# Patient Record
Sex: Male | Born: 1955
Health system: Southern US, Community
[De-identification: ages and names within clinical notes are randomized; demographics above are authoritative.]

## PROBLEM LIST (undated history)

## (undated) DIAGNOSIS — S82899A Other fracture of unspecified lower leg, initial encounter for closed fracture: Secondary | ICD-10-CM

## (undated) DIAGNOSIS — E785 Hyperlipidemia, unspecified: Secondary | ICD-10-CM

## (undated) DIAGNOSIS — I10 Essential (primary) hypertension: Secondary | ICD-10-CM

---

## 2012-01-25 ENCOUNTER — Emergency Department (HOSPITAL_COMMUNITY)
Admission: EM | Admit: 2012-01-25 | Discharge: 2012-01-25 | Disposition: A | Discharge status: Home Health | Payer: BC Managed Care – PPO | Attending: Emergency Medicine | Admitting: Emergency Medicine

## 2012-01-25 ENCOUNTER — Emergency Department (HOSPITAL_COMMUNITY): Payer: BC Managed Care – PPO

## 2012-01-25 ENCOUNTER — Encounter (HOSPITAL_COMMUNITY): Payer: Self-pay | Admitting: Emergency Medicine

## 2012-01-25 DIAGNOSIS — S9000XA Contusion of unspecified ankle, initial encounter: Secondary | ICD-10-CM | POA: Insufficient documentation

## 2012-01-25 DIAGNOSIS — S8990XA Unspecified injury of unspecified lower leg, initial encounter: Secondary | ICD-10-CM | POA: Insufficient documentation

## 2012-01-25 DIAGNOSIS — M79609 Pain in unspecified limb: Secondary | ICD-10-CM | POA: Insufficient documentation

## 2012-01-25 DIAGNOSIS — M25579 Pain in unspecified ankle and joints of unspecified foot: Secondary | ICD-10-CM | POA: Insufficient documentation

## 2012-01-25 DIAGNOSIS — X500XXA Overexertion from strenuous movement or load, initial encounter: Secondary | ICD-10-CM | POA: Insufficient documentation

## 2012-01-25 DIAGNOSIS — M25473 Effusion, unspecified ankle: Secondary | ICD-10-CM | POA: Insufficient documentation

## 2012-01-25 DIAGNOSIS — S82402A Unspecified fracture of shaft of left fibula, initial encounter for closed fracture: Secondary | ICD-10-CM

## 2012-01-25 DIAGNOSIS — S82843A Displaced bimalleolar fracture of unspecified lower leg, initial encounter for closed fracture: Secondary | ICD-10-CM | POA: Insufficient documentation

## 2012-01-25 DIAGNOSIS — S82899A Other fracture of unspecified lower leg, initial encounter for closed fracture: Secondary | ICD-10-CM

## 2012-01-25 DIAGNOSIS — M25476 Effusion, unspecified foot: Secondary | ICD-10-CM | POA: Insufficient documentation

## 2012-01-25 HISTORY — DX: Hyperlipidemia, unspecified: E78.5

## 2012-01-25 MED ORDER — FENTANYL CITRATE 0.05 MG/ML IJ SOLN
50.0000 ug | Freq: Once | INTRAMUSCULAR | Status: AC
  Administered 2012-01-25: 50 ug via INTRAVENOUS
  Filled 2012-01-25: qty 2

## 2012-01-25 MED ORDER — PROPOFOL 10 MG/ML IV BOLUS
INTRAVENOUS | Status: DC | PRN
  Administered 2012-01-25 (×2): 40 mg via INTRAVENOUS

## 2012-01-25 MED ORDER — HYDROMORPHONE HCL PF 1 MG/ML IJ SOLN
1.0000 mg | Freq: Once | INTRAMUSCULAR | Status: AC
  Administered 2012-01-25: 1 mg via INTRAVENOUS
  Filled 2012-01-25: qty 1

## 2012-01-25 MED ORDER — ONDANSETRON HCL 4 MG/2ML IJ SOLN
4.0000 mg | Freq: Once | INTRAMUSCULAR | Status: AC
  Administered 2012-01-25: 4 mg via INTRAVENOUS
  Filled 2012-01-25: qty 2

## 2012-01-25 MED ORDER — PROPOFOL 10 MG/ML IV BOLUS
200.0000 mg | Freq: Once | INTRAVENOUS | Status: AC
  Administered 2012-01-25: 80 mg via INTRAVENOUS

## 2012-01-25 MED ORDER — OXYCODONE-ACETAMINOPHEN 5-325 MG PO TABS: 1.0000 | ORAL_TABLET | Freq: Four times a day (QID) | ORAL | Status: DC | PRN

## 2012-01-25 MED ORDER — PROPOFOL 10 MG/ML IV EMUL
INTRAVENOUS | Status: AC
  Filled 2012-01-25: qty 20

## 2012-01-25 NOTE — ED Notes (Addendum)
Pt brought in via EMS from home. Pt states he was working on [employer] and tangled foot in wire fence and fell. Left ankle swollen with obvious deformity. EMS placed rigid foam splint to ankle with pulses present pre and post splint. Pt given Morphine 10mg  IV by EMS.

## 2012-01-25 NOTE — ED Notes (Signed)
Consent signed by patient for reduction of left ankle, patient with verbalized understanding of procedure with no questions.

## 2012-01-25 NOTE — ED Notes (Signed)
Left ankle reduced by [place], spint applied per ortho tech.

## 2012-01-25 NOTE — ED Notes (Addendum)
Patient has returned to preprocedure neuro status.

## 2012-01-25 NOTE — Progress Notes (Signed)
Orthopedic Tech Progress Note Patient Details:  Darren Russo 1956/03/28 [id number]  Other Ortho Devices Type of Ortho Device: Crutches Ortho Device Location: (L) LE Ortho Device Interventions: Ordered  Type of Splint: Short Leg;Stirrup Splint Location: (L) LE Splint Interventions: Application    [name] 01/25/2012, 8:51 PM

## 2012-01-25 NOTE — ED Provider Notes (Signed)
Medical screening examination/treatment/procedure(s) were conducted as a shared visit with non-physician practitioner(s) and myself.  I personally evaluated the patient during the encounter.  Closed fracture dislocation left ankle. Reduced in the ER under conscious sedation. He'll followup with orthopedic surgery  Procedural sedation Performed by: Billee Cashing. Consent: Verbal consent obtained. Risks and benefits: risks, benefits and alternatives were discussed Required items: required blood products, implants, devices, and special equipment available Patient identity confirmed: arm band and provided demographic data Time out: Immediately prior to procedure a "time out" was called to verify the correct patient, procedure, equipment, support staff and site/side marked as required.  Sedation type: moderate (conscious) sedation NPO time confirmed and considedered  Sedatives: PROPOFOL  Physician Time at Bedside: 10  Vitals: Vital signs were monitored during sedation. Cardiac Monitor, pulse oximeter Patient tolerance: Patient tolerated the procedure well with no immediate complications. Comments: Pt with uneventful recovered. Returned to pre-procedural sedation baseline    Juliet Rude. Rubin Payor, MD 01/25/12 [phone]

## 2012-01-25 NOTE — ED Notes (Signed)
Patient prepared for conscious sedation, patient on cardiac monitor, oxygen at 2 lpm via [place], airway cart at bedside.

## 2012-01-25 NOTE — Discharge Instructions (Signed)
Ankle Fracture A fracture is a break in the bone. A cast or splint is used to protect and keep your injured bone from moving.  HOME CARE INSTRUCTIONS   Use your crutches as directed.   To lessen the swelling, keep the injured leg elevated while sitting or lying down.   Apply ice to the injury for 15 to 20 minutes, 3 to 4 times per day while awake for 2 days. Put the ice in a plastic bag and place a thin towel between the bag of ice and your cast.   If you have a plaster or fiberglass cast:   Do not try to scratch the skin under the cast using sharp or pointed objects.   Check the skin around the cast every day. You may put lotion on any red or sore areas.   Keep your cast dry and clean.   If you have a plaster splint:   Wear the splint as directed.   You may loosen the elastic around the splint if your toes become numb, tingle, or turn cold or blue.   Do not put pressure on any part of your cast or splint; it may break. Rest your cast only on a pillow the first 24 hours until it is fully hardened.   Your cast or splint can be protected during bathing with a plastic bag. Do not lower the cast or splint into water.   Take medications as directed by your caregiver. Only take over-the-counter or prescription medicines for pain, discomfort, or fever as directed by your caregiver.   Do not drive a vehicle until your caregiver specifically tells you it is safe to do so.   If your caregiver has given you a follow-up appointment, it is very important to keep that appointment. Not keeping the appointment could result in a chronic or permanent injury, pain, and disability. If there is any problem keeping the appointment, you must call back to this facility for assistance.  SEEK IMMEDIATE MEDICAL CARE IF:   Your cast gets damaged or breaks.   You have continued severe pain or more swelling than you did before the cast was put on.   Your skin or toenails below the injury turn blue or gray,  or feel cold or numb.   There is a bad smell or new stains and/or purulent (pus like) drainage coming from under the cast.  If you do not have a window in your cast for observing the wound, a discharge or minor bleeding may show up as a stain on the outside of your cast. Report these findings to your caregiver. MAKE SURE YOU:   Understand these instructions.   Will watch your condition.   Will get help right away if you are not doing well or get worse.  Document Released: 10/12/2000 Document Revised: 10/04/2011 Document Reviewed: 05/18/2008 [facility] Patient Information 2012 [place], [place].  Fibular Fracture, Ankle, Adult, Treated with or without Immobilization You have a fracture (break) of your fibula. This is the bone in your lower leg located on the outside of the leg. These fractures are easily diagnosed with x-rays. TREATMENT  You have a simple fracture of the part of the fibula which is located between the knee and ankle. This bone usually will heal without problems and can often be treated without casting or splinting. This means the fracture will heal well during normal use and daily activities without being held in place. Sometimes a cast or splint is placed on these fractures if  it is needed for comfort or if the bones are badly out of place. HOME CARE INSTRUCTIONS   Apply ice to the injury for 15 to 20 minutes, 3 to 4 times per day while awake, for 2 days. Put the ice in a plastic bag and place a thin towel between the bag of ice and your leg. This helps keep swelling down.   Use crutches as directed. Resume walking without crutches as directed by your caregiver or when comfortable doing so.   Only take over-the-counter or prescription medicines for pain, discomfort, or fever as directed by your caregiver.   Keep appointments for follow up x-rays if these are required.   If you have a removable splint or boot, do not remove the boot unless directed by your caregiver.    Warning: Do not drive a car or operate a motor vehicle until your caregiver specifically tells you it is safe to do so.  SEEK MEDICAL CARE IF:   You have continued severe pain or more swelling   The medications do not control the pain.   Your skin or nails below the injury turn blue or grey or feel cold or numb.   You develop severe pain in the leg or foot.  MAKE SURE YOU:   Understand these instructions.   Will watch your condition.   Will get help right away if you are not doing well or get worse.  Document Released: 10/15/2005 Document Revised: 10/04/2011 Document Reviewed: 05/21/2008 [facility] Patient Information 2012 [place], [place].

## 2012-01-25 NOTE — ED Provider Notes (Signed)
History     CSN: [id number]  Arrival date & time 01/25/12  1191   First MD Initiated Contact with Patient 01/25/12 1732      Chief Complaint  Patient presents with  . Ankle Pain    (Consider location/radiation/quality/duration/timing/severity/associated sxs/prior treatment) HPI  56 y.o M presents c/o L ankle injury.  Was working on [employer] when L foot got tangled in wire fence and caused it to twist.  He realized that he may have broken it, and were able to lower self to ground.  Denies hitting head or LOC however notice sig pain to L ankle. Denies any other injury.  Pain is sharp, throbbing, persistence, but denies numbness.  Last meal was a sneaker bar 2 hrs ago.  Was given morphine 10mg  en route via EMS.  Sts pain is a 5/10 at this time.  Denies any allergies.    History reviewed. No pertinent past medical history.  History reviewed. No pertinent past surgical history.  History reviewed. No pertinent family history.  History  Substance Use Topics  . Smoking status: Not on file  . Smokeless tobacco: Not on file  . Alcohol Use: Not on file      Review of Systems  All other systems reviewed and are negative.    Allergies  Review of patient's allergies indicates not on file.  Home Medications  No current outpatient prescriptions on file.  Ht 6' (1.829 m)  Wt 184 lb (83.462 kg)  BMI 24.95 kg/m2  Physical Exam  Nursing note and vitals reviewed. Constitutional: He appears well-developed and well-nourished. No distress.  HENT:  Head: Atraumatic.  Eyes: Conjunctivae are normal.  Neck: Neck supple.  Cardiovascular: Normal rate and regular rhythm.   Pulmonary/Chest: Effort normal. No respiratory distress.  Abdominal: Soft.  Musculoskeletal: He exhibits tenderness. He exhibits no edema.       Right hip: Normal.       Right knee: Normal.       Left knee: Normal.       Right ankle: He exhibits normal range of motion, no swelling, no ecchymosis, no deformity,  no laceration and normal pulse. No head of 5th metatarsal tenderness found.       Left ankle: He exhibits decreased range of motion, swelling, ecchymosis and deformity. He exhibits no laceration and normal pulse. tenderness. Lateral malleolus, medial malleolus, posterior TFL and proximal fibula tenderness found. No head of 5th metatarsal tenderness found.       Left lower leg: He exhibits no swelling, no edema and no deformity.       L ankle: obvious deformity at ankle laterally with skin tinting medially but no open fracture.  Tenderness throughout.  Palpable pedal pulse with brisk cap refills.  L foot with no deformity or point tenderness.  Normal L ankle.      ED Course  Reduction of fracture of L ankle Date/Time: 01/25/2012 8:13 PM Performed by: Fayrene Helper Authorized by: Fayrene Helper Consent: Verbal consent obtained. Written consent obtained. Risks and benefits: risks, benefits and alternatives were discussed Consent given by: patient Patient understanding: patient states understanding of the procedure being performed Patient consent: the patient's understanding of the procedure matches consent given Procedure consent: procedure consent matches procedure scheduled Relevant documents: relevant documents present and verified Test results: test results available and properly labeled Site marked: the operative site was marked Imaging studies: imaging studies available Required items: required blood products, implants, devices, and special equipment available Patient identity confirmed: arm band and  verbally with patient Time out: Immediately prior to procedure a "time out" was called to verify the correct patient, procedure, equipment, support staff and site/side marked as required. Local anesthesia used: no Patient sedated: yes Sedatives: propofol Analgesia: hydromorphone Sedation start date/time: 01/25/2012 8:00 PM Sedation end date/time: 01/25/2012 8:14 PM Vitals: Vital signs were  monitored during sedation. Patient tolerance: Patient tolerated the procedure well with no immediate complications.   (including critical care time)  Labs Reviewed - No data to display No results found.   No diagnosis found.  No results found for this or any previous visit. Dg Tibia/fibula Left  01/25/2012  *RADIOLOGY REPORT*  Clinical Data: Postreduction.  Left lower leg pain, injury.  LEFT TIBIA AND FIBULA - 2 VIEW  Comparison: Left ankle series 01/25/2012  Findings: Interval reduction of the left ankle fracture dislocation.  Mild continued displacement of the medial malleolar fracture and slight displacement of the posterior malleolar fracture.  Ankle mortise alignment much improved.  There is an oblique fracture through the proximal and midportions of the left fibular shaft.  Approximate 7 mm of anterior displacement of the distal fragment relative to the proximal fragment.  IMPRESSION: Postreduction of the left ankle as above.  Oblique mildly displaced proximal and mid shaft fibular fracture.  Original Report Authenticated By: Cyndie Chime, M.D.   Dg Ankle Complete Left  01/25/2012  *RADIOLOGY REPORT*  Clinical Data: Postreduction.  LEFT ANKLE COMPLETE - 3+ VIEW  Comparison: 01/25/2012  Findings: Interval reduction of the left ankle fracture dislocation.  Slight displacement at the medial and posterior malleolar fractures.  Ankle mortise in near anatomic alignment.  IMPRESSION: Interval reduction as above.  Original Report Authenticated By: Cyndie Chime, M.D.   Dg Ankle Complete Left  01/25/2012  *RADIOLOGY REPORT*  Clinical Data: Trauma.  Left ankle pain.  LEFT ANKLE COMPLETE - 3+ VIEW  Comparison: None.  Findings: Medial and posterior malleolar fracture present with posterior dislocation of the talus relative to the tibial plafond. The lateral malleolus grossly appears intact although there could be avulsion fragments from the lateral malleolus associated with ankle dislocation.   Subtalar joints appear intact.  Talonavicular joint and calcaneal cuboid joint appear intact.  Obvious soft tissue deformity is present.  There is no gas in the soft tissues to suggest open fracture.  IMPRESSION: Medial and posterior malleolar ankle fracture with tibiotalar dislocation.  Original Report Authenticated By: Andreas Newport, M.D.    No results found for this or any previous visit. Dg Tibia/fibula Left  01/25/2012  *RADIOLOGY REPORT*  Clinical Data: Postreduction.  Left lower leg pain, injury.  LEFT TIBIA AND FIBULA - 2 VIEW  Comparison: Left ankle series 01/25/2012  Findings: Interval reduction of the left ankle fracture dislocation.  Mild continued displacement of the medial malleolar fracture and slight displacement of the posterior malleolar fracture.  Ankle mortise alignment much improved.  There is an oblique fracture through the proximal and midportions of the left fibular shaft.  Approximate 7 mm of anterior displacement of the distal fragment relative to the proximal fragment.  IMPRESSION: Postreduction of the left ankle as above.  Oblique mildly displaced proximal and mid shaft fibular fracture.  Original Report Authenticated By: Cyndie Chime, M.D.   Dg Ankle Complete Left  01/25/2012  *RADIOLOGY REPORT*  Clinical Data: Postreduction.  LEFT ANKLE COMPLETE - 3+ VIEW  Comparison: 01/25/2012  Findings: Interval reduction of the left ankle fracture dislocation.  Slight displacement at the medial and posterior malleolar fractures.  Ankle  mortise in near anatomic alignment.  IMPRESSION: Interval reduction as above.  Original Report Authenticated By: Cyndie Chime, M.D.   Dg Ankle Complete Left  01/25/2012  *RADIOLOGY REPORT*  Clinical Data: Trauma.  Left ankle pain.  LEFT ANKLE COMPLETE - 3+ VIEW  Comparison: None.  Findings: Medial and posterior malleolar fracture present with posterior dislocation of the talus relative to the tibial plafond. The lateral malleolus grossly appears intact  although there could be avulsion fragments from the lateral malleolus associated with ankle dislocation.  Subtalar joints appear intact.  Talonavicular joint and calcaneal cuboid joint appear intact.  Obvious soft tissue deformity is present.  There is no gas in the soft tissues to suggest open fracture.  IMPRESSION: Medial and posterior malleolar ankle fracture with tibiotalar dislocation.  Original Report Authenticated By: Andreas Newport, M.D.       MDM  Obvious fracture dislocation on initial exam.  Will obtain L ankle xray for further evaluation.  Fentanyl IV given for pain.  No other injury.  Pedal pulse palpable, sensation intact.  My attending also presence and have evaluate pt.    8:15 PM Under my attending's supervision, L ankle were reduced by me.  Ortho tech apply splinting immediately afterward.  Pt tolerates procedure well. WIll perform post-op xray of L ankle.      9:17 PM Postreduction film shows normal alignment has been established. X-ray of tib-fib shows evidence of Mas[place]uve fracture.  With consult with Dr. Lequita Halt.  He is aware.  Long leg splinting applied, with crutches. Patient able to tolerate by mouth pain medication.  Fayrene Helper, PA-C 01/25/12 2145

## 2012-01-29 ENCOUNTER — Encounter (HOSPITAL_COMMUNITY): Payer: Self-pay | Admitting: *Deleted

## 2012-01-29 NOTE — H&P (Signed)
  Arna Medici DOB: 1956-01-15  Chief Complaint: left ankle pain  History of Present Illness The patient is a 56 year old male who is scheduled for an open reduction internal fixation of the left ankle medial malleolus by Dr. Darrelyn Hillock at [facility] on Wednesday January 30, 2012. The patient reports left ankle symptoms including: pain and swelling which began 4 day(s) ago following a specific injury in which he hopped a fence and his pants got caught causing him to land badly on his left foot. He was taken to [facility] where xrays were taken. The injury occurred due to a fall while the patient was at home. The patient does not report any radiation of symptoms. He reports that the ankle was dislocated and the ankle was reduced once at the ED.Marland Kitchen He reports that since then he has been nonweightbearing and using crutches.    Allergies No Known Drug Allergies.    Past Medical History Hypercholesteremia   Family History Cancer. mother Heart Disease. father Rheumatoid Arthritis. grandmother mothers side   Social History Alcohol use. current drinker; drinks beer; only occasionally per week Children. 1 Current work status. working full time Exercise. Exercises rarely; does other Living situation. live with spouse Marital status. married Number of flights of stairs before winded. greater than 5 Tobacco / smoke exposure. no Tobacco use. never smoker   Medication History Percocet (5-325MG  Tablet, Oral 4-6 hours, prn pain) Active. Lipitor (40MG  Tablet, Oral daily) Active.   Past Surgical History No pertinent past surgical history   Review of Systems General:Not Present- Chills, Fever, Night Sweats, Appetite Loss, Fatigue, Feeling sick, Weight Gain and Weight Loss. Skin:Not Present- Itching, Rash, Skin Color Changes, Ulcer, Psoriasis and Change in Hair or Nails. HEENT:Not Present- Sensitivity to light, Hearing problems, Nose Bleed and Ringing in the  Ears. Neck:Not Present- Swollen Glands and Neck Mass. Respiratory:Not Present- Snoring, Chronic Cough, Bloody sputum and Dyspnea. Cardiovascular:Not Present- Shortness of Breath, Chest Pain, Swelling of Extremities, Leg Cramps and Palpitations. Gastrointestinal:Not Present- Bloody Stool, Heartburn, Abdominal Pain, Vomiting, Nausea and Incontinence of Stool. Male Genitourinary:Not Present- Blood in Urine, Frequency, Incontinence and Nocturia. Musculoskeletal:Present- Joint Swelling and Joint Pain. Not Present- Muscle Weakness, Muscle Pain, Joint Stiffness and Back Pain. Neurological:Not Present- Tingling, Numbness, Burning, Tremor, Headaches and Dizziness. Psychiatric:Not Present- Anxiety, Depression and Memory Loss. Endocrine:Not Present- Cold Intolerance, Heat Intolerance, Excessive hunger and Excessive Thirst. Hematology:Not Present- Abnormal Bleeding, Anemia, Blood Clots and Easy Bruising.   Vitals Weight: 185 lb Height: 72 in Body Surface Area: 2.06 m Body Mass Index: 25.09 kg/m Pain Level: 1/10 Pulse: 90 (Regular) BP: 155/95 (Sitting, Left Arm, Standard)    Physical Exam Alert and oriented. No acute distress. Left ankle severe soft tissue swelling and brusing over the medial and lateral malleolus. Dorsalis pedis pulse 2+. Sensation intact. Plantar flexion and dorsiflexion intact but with pain. Calves soft and nontender. Left knee normal painless ROM. Heart soundes normal. Regular rate and rhythm. No murmurs. Lungs clear to auscultation. Abdomen soft and nontender. Oral cavity unremarkable.  Radiographs X-rays were taken in the ED at [facility] on friday 01/25/12 the day of the injury. Medial and posterior malleolar fracture present with posterior dislocation the was reduced in the ED. Also present is an oblique fracture of the left proximal to mid fibula with anterior displacement.   Assessment & Plan Medial malleolar fracture (824.0) ORIF medial  malleolus left ankle   Dimitri Ped, PA-C

## 2012-01-29 NOTE — Discharge Instructions (Signed)
Darren Russo  01/29/2012   Your procedure is scheduled on:  01/30/12  Report to SHORT STAY DEPT  at 9:00 AM.  Call this number if you have problems the morning of surgery: [phone]   Remember:   Do not eat food or drink liquids AFTER MIDNIGHT  May have clear liquids UNTIL 6 HOURS BEFORE SURGERY  Clear liquids include soda, tea, black coffee, apple or grape juice, broth.  Take these medicines the morning of surgery with A SIP OF WATER: Lipitor / percocet   Do not wear jewelry, make-up or nail polish.  Do not wear lotions, powders, or perfumes.   Do not shave legs or underarms 48 hrs. before surgery (men may shave face)  Do not bring valuables to the hospital.  Contacts, dentures or bridgework may not be worn into surgery.  Leave suitcase in the car. After surgery it may be brought to your room.  For patients admitted to the hospital, checkout time is 11:00 AM the day of discharge.   Patients discharged the day of surgery will not be allowed to drive home.  Name and phone number of your driver: wife  Special Instructions:   Please read over the following fact sheets that you were given: MRSA  Information               SHOWER WITH BETASEPT THE NIGHT BEFORE SURGERY AND THE MORNING OF SURGERY

## 2012-01-30 ENCOUNTER — Inpatient Hospital Stay (HOSPITAL_COMMUNITY)
Admission: RE | Admit: 2012-01-30 | Discharge: 2012-02-01 | DRG: 219 | Disposition: A | Discharge status: Home / Self Care | Payer: BC Managed Care – PPO | Source: Ambulatory Visit | Attending: Orthopedic Surgery | Admitting: Orthopedic Surgery

## 2012-01-30 ENCOUNTER — Encounter (HOSPITAL_COMMUNITY)
Admission: RE | Disposition: A | Discharge status: Home / Self Care | Payer: Self-pay | Source: Ambulatory Visit | Attending: Orthopedic Surgery

## 2012-01-30 ENCOUNTER — Ambulatory Visit (HOSPITAL_COMMUNITY): Payer: BC Managed Care – PPO

## 2012-01-30 ENCOUNTER — Encounter (HOSPITAL_COMMUNITY): Payer: Self-pay | Admitting: *Deleted

## 2012-01-30 ENCOUNTER — Encounter (HOSPITAL_COMMUNITY): Payer: Self-pay | Admitting: Anesthesiology

## 2012-01-30 ENCOUNTER — Ambulatory Visit (HOSPITAL_COMMUNITY): Payer: BC Managed Care – PPO | Admitting: Anesthesiology

## 2012-01-30 DIAGNOSIS — Y92009 Unspecified place in unspecified non-institutional (private) residence as the place of occurrence of the external cause: Secondary | ICD-10-CM

## 2012-01-30 DIAGNOSIS — S82843A Displaced bimalleolar fracture of unspecified lower leg, initial encounter for closed fracture: Principal | ICD-10-CM | POA: Diagnosis present

## 2012-01-30 DIAGNOSIS — W1789XA Other fall from one level to another, initial encounter: Secondary | ICD-10-CM | POA: Diagnosis present

## 2012-01-30 DIAGNOSIS — E785 Hyperlipidemia, unspecified: Secondary | ICD-10-CM | POA: Diagnosis present

## 2012-01-30 HISTORY — PX: ORIF ANKLE FRACTURE: SHX5408

## 2012-01-30 HISTORY — DX: Other fracture of unspecified lower leg, initial encounter for closed fracture: S82.899A

## 2012-01-30 LAB — URINALYSIS, ROUTINE W REFLEX MICROSCOPIC
Bilirubin Urine: NEGATIVE
Glucose, UA: NEGATIVE mg/dL
Hgb urine dipstick: NEGATIVE
Ketones, ur: NEGATIVE mg/dL
Leukocytes, UA: NEGATIVE
Nitrite: NEGATIVE
Protein, ur: NEGATIVE mg/dL
Specific Gravity, Urine: 1.022 (ref 1.005–1.030)
Urobilinogen, UA: 0.2 mg/dL (ref 0.0–1.0)
pH: 7.5 (ref 5.0–8.0)

## 2012-01-30 LAB — CBC
HCT: 43.5 % (ref 39.0–52.0)
Hemoglobin: 14.6 g/dL (ref 13.0–17.0)
MCH: 31.5 pg (ref 26.0–34.0)
MCHC: 33.6 g/dL (ref 30.0–36.0)
MCV: 93.8 fL (ref 78.0–100.0)
Platelets: 258 10*3/uL (ref 150–400)
RBC: 4.64 MIL/uL (ref 4.22–5.81)
RDW: 12 % (ref 11.5–15.5)
WBC: 8.2 10*3/uL (ref 4.0–10.5)

## 2012-01-30 LAB — DIFFERENTIAL
Basophils Absolute: 0 10*3/uL (ref 0.0–0.1)
Basophils Relative: 1 % (ref 0–1)
Eosinophils Absolute: 0.2 10*3/uL (ref 0.0–0.7)
Eosinophils Relative: 2 % (ref 0–5)
Lymphocytes Relative: 20 % (ref 12–46)
Lymphs Abs: 1.6 10*3/uL (ref 0.7–4.0)
Monocytes Absolute: 0.6 10*3/uL (ref 0.1–1.0)
Monocytes Relative: 8 % (ref 3–12)
Neutro Abs: 5.7 10*3/uL (ref 1.7–7.7)
Neutrophils Relative %: 69 % (ref 43–77)

## 2012-01-30 LAB — COMPREHENSIVE METABOLIC PANEL
ALT: 20 U/L (ref 0–53)
AST: 16 U/L (ref 0–37)
Albumin: 3.4 g/dL — ABNORMAL LOW (ref 3.5–5.2)
Alkaline Phosphatase: 50 U/L (ref 39–117)
BUN: 12 mg/dL (ref 6–23)
CO2: 28 mEq/L (ref 19–32)
Calcium: 8.9 mg/dL (ref 8.4–10.5)
Chloride: 106 mEq/L (ref 96–112)
Creatinine, Ser: 0.86 mg/dL (ref 0.50–1.35)
GFR calc Af Amer: >90 mL/min (ref >90)
GFR calc non Af Amer: >90 mL/min (ref >90)
Glucose, Bld: 111 mg/dL — ABNORMAL HIGH (ref 70–99)
Potassium: 4.1 mEq/L (ref 3.5–5.1)
Sodium: 141 mEq/L (ref 135–145)
Total Bilirubin: 0.4 mg/dL (ref 0.3–1.2)
Total Protein: 6.6 g/dL (ref 6.0–8.3)

## 2012-01-30 LAB — TYPE AND SCREEN
ABO/RH(D): O POS
Antibody Screen: NEGATIVE

## 2012-01-30 LAB — APTT: aPTT: 27 seconds (ref 24–37)

## 2012-01-30 LAB — SURGICAL PCR SCREEN: MRSA, PCR: NEGATIVE

## 2012-01-30 LAB — PROTIME-INR
INR: 0.95 (ref 0.00–1.49)
Prothrombin Time: 12.9 seconds (ref 11.6–15.2)

## 2012-01-30 SURGERY — OPEN REDUCTION INTERNAL FIXATION (ORIF) ANKLE FRACTURE
Anesthesia: General | Site: Ankle | Laterality: Left | Wound class: Clean

## 2012-01-30 MED ORDER — PHENOL 1.4 % MT LIQD: 1.0000 | OROMUCOSAL | Status: DC | PRN

## 2012-01-30 MED ORDER — HYDROMORPHONE HCL PF 1 MG/ML IJ SOLN
INTRAMUSCULAR | Status: DC | PRN
  Administered 2012-01-30 (×4): 0.5 mg via INTRAVENOUS

## 2012-01-30 MED ORDER — SODIUM CHLORIDE 0.9 % IR SOLN
Status: DC | PRN
  Administered 2012-01-30: 14:00:00

## 2012-01-30 MED ORDER — HYDROMORPHONE HCL PF 1 MG/ML IJ SOLN
INTRAMUSCULAR | Status: AC
  Administered 2012-01-30: 1 mg via INTRAVENOUS
  Filled 2012-01-30: qty 1

## 2012-01-30 MED ORDER — CEFAZOLIN SODIUM-DEXTROSE 2-3 GM-% IV SOLR
INTRAVENOUS | Status: AC
  Filled 2012-01-30: qty 50

## 2012-01-30 MED ORDER — ACETAMINOPHEN 325 MG PO TABS: 650.0000 mg | ORAL_TABLET | Freq: Four times a day (QID) | ORAL | Status: DC | PRN

## 2012-01-30 MED ORDER — HYDROMORPHONE HCL PF 1 MG/ML IJ SOLN
0.2500 mg | INTRAMUSCULAR | Status: DC | PRN
  Administered 2012-01-30 (×2): 0.5 mg via INTRAVENOUS

## 2012-01-30 MED ORDER — CEFAZOLIN SODIUM-DEXTROSE 2-3 GM-% IV SOLR
2.0000 g | INTRAVENOUS | Status: AC
  Administered 2012-01-30: 2 g via INTRAVENOUS

## 2012-01-30 MED ORDER — MEPERIDINE HCL 50 MG/ML IJ SOLN: 6.2500 mg | INTRAMUSCULAR | Status: DC | PRN

## 2012-01-30 MED ORDER — LACTATED RINGERS IV SOLN
INTRAVENOUS | Status: DC
  Administered 2012-01-30: 14:00:00 via INTRAVENOUS
  Administered 2012-01-30: 1000 mL via INTRAVENOUS

## 2012-01-30 MED ORDER — HYDROCODONE-ACETAMINOPHEN 10-325 MG PO TABS
1.0000 | ORAL_TABLET | ORAL | Status: DC | PRN
  Administered 2012-01-30 (×2): 2 via ORAL
  Filled 2012-01-30 (×2): qty 2

## 2012-01-30 MED ORDER — ACETAMINOPHEN 10 MG/ML IV SOLN
INTRAVENOUS | Status: AC
  Filled 2012-01-30: qty 100

## 2012-01-30 MED ORDER — CEFAZOLIN SODIUM 1-5 GM-% IV SOLN: 1.0000 g | INTRAVENOUS | Status: DC

## 2012-01-30 MED ORDER — FLEET ENEMA 7-19 GM/118ML RE ENEM: 1.0000 | ENEMA | Freq: Once | RECTAL | Status: AC | PRN

## 2012-01-30 MED ORDER — ALUM & MAG HYDROXIDE-SIMETH 200-200-20 MG/5ML PO SUSP: 30.0000 mL | ORAL | Status: DC | PRN

## 2012-01-30 MED ORDER — ACETAMINOPHEN 10 MG/ML IV SOLN
INTRAVENOUS | Status: DC | PRN
  Administered 2012-01-30: 1000 mg via INTRAVENOUS

## 2012-01-30 MED ORDER — PROPOFOL 10 MG/ML IV EMUL
INTRAVENOUS | Status: DC | PRN
  Administered 2012-01-30: 170 mg via INTRAVENOUS
  Administered 2012-01-30: 30 mg via INTRAVENOUS

## 2012-01-30 MED ORDER — MENTHOL 3 MG MT LOZG: 1.0000 | LOZENGE | OROMUCOSAL | Status: DC | PRN

## 2012-01-30 MED ORDER — POLYETHYLENE GLYCOL 3350 17 G PO PACK: 17.0000 g | PACK | Freq: Every day | ORAL | Status: DC | PRN

## 2012-01-30 MED ORDER — MIDAZOLAM HCL 5 MG/5ML IJ SOLN
INTRAMUSCULAR | Status: DC | PRN
  Administered 2012-01-30: 2 mg via INTRAVENOUS

## 2012-01-30 MED ORDER — OXYCODONE-ACETAMINOPHEN 5-325 MG PO TABS
1.0000 | ORAL_TABLET | ORAL | Status: DC | PRN
  Administered 2012-01-31 – 2012-02-01 (×6): 2 via ORAL
  Filled 2012-01-30 (×7): qty 2

## 2012-01-30 MED ORDER — ONDANSETRON HCL 4 MG/2ML IJ SOLN
INTRAMUSCULAR | Status: DC | PRN
  Administered 2012-01-30: 4 mg via INTRAVENOUS

## 2012-01-30 MED ORDER — METHOCARBAMOL 100 MG/ML IJ SOLN
500.0000 mg | Freq: Four times a day (QID) | INTRAVENOUS | Status: DC | PRN
  Administered 2012-01-30: 500 mg via INTRAVENOUS
  Filled 2012-01-30: qty 5

## 2012-01-30 MED ORDER — FENTANYL CITRATE 0.05 MG/ML IJ SOLN
INTRAMUSCULAR | Status: AC
  Filled 2012-01-30: qty 2

## 2012-01-30 MED ORDER — MUPIROCIN 2 % EX OINT
TOPICAL_OINTMENT | CUTANEOUS | Status: AC
  Filled 2012-01-30: qty 22

## 2012-01-30 MED ORDER — LACTATED RINGERS IV SOLN
INTRAVENOUS | Status: DC
  Administered 2012-01-31 (×2): via INTRAVENOUS

## 2012-01-30 MED ORDER — ATORVASTATIN CALCIUM 40 MG PO TABS
40.0000 mg | ORAL_TABLET | Freq: Every day | ORAL | Status: DC
  Administered 2012-01-30 – 2012-02-01 (×3): 40 mg via ORAL
  Filled 2012-01-30 (×3): qty 1

## 2012-01-30 MED ORDER — ONDANSETRON HCL 4 MG/2ML IJ SOLN: 4.0000 mg | Freq: Four times a day (QID) | INTRAMUSCULAR | Status: DC | PRN

## 2012-01-30 MED ORDER — BACITRACIN-NEOMYCIN-POLYMYXIN 400-5-5000 EX OINT
TOPICAL_OINTMENT | CUTANEOUS | Status: AC
  Filled 2012-01-30: qty 1

## 2012-01-30 MED ORDER — HYDROMORPHONE HCL PF 1 MG/ML IJ SOLN
0.5000 mg | INTRAMUSCULAR | Status: DC | PRN
  Administered 2012-01-30 – 2012-01-31 (×3): 1 mg via INTRAVENOUS
  Filled 2012-01-30 (×3): qty 1

## 2012-01-30 MED ORDER — FENTANYL CITRATE 0.05 MG/ML IJ SOLN
INTRAMUSCULAR | Status: DC | PRN
  Administered 2012-01-30: 100 ug via INTRAVENOUS
  Administered 2012-01-30 (×3): 50 ug via INTRAVENOUS

## 2012-01-30 MED ORDER — CEFAZOLIN SODIUM 1-5 GM-% IV SOLN
1.0000 g | Freq: Four times a day (QID) | INTRAVENOUS | Status: AC
  Administered 2012-01-30 (×2): 1 g via INTRAVENOUS
  Filled 2012-01-30 (×2): qty 50

## 2012-01-30 MED ORDER — LIDOCAINE HCL 1 % IJ SOLN
INTRAMUSCULAR | Status: DC | PRN
  Administered 2012-01-30: 60 mg via INTRADERMAL

## 2012-01-30 MED ORDER — FENTANYL CITRATE 0.05 MG/ML IJ SOLN
50.0000 ug | INTRAMUSCULAR | Status: DC | PRN
  Administered 2012-01-30: 50 ug via INTRAVENOUS

## 2012-01-30 MED ORDER — PROMETHAZINE HCL 25 MG/ML IJ SOLN: 6.2500 mg | INTRAMUSCULAR | Status: DC | PRN

## 2012-01-30 MED ORDER — BISACODYL 10 MG RE SUPP: 10.0000 mg | Freq: Every day | RECTAL | Status: DC | PRN

## 2012-01-30 MED ORDER — LACTATED RINGERS IV SOLN: INTRAVENOUS | Status: DC | PRN

## 2012-01-30 MED ORDER — ONDANSETRON HCL 4 MG PO TABS: 4.0000 mg | ORAL_TABLET | Freq: Four times a day (QID) | ORAL | Status: DC | PRN

## 2012-01-30 MED ORDER — ACETAMINOPHEN 650 MG RE SUPP: 650.0000 mg | Freq: Four times a day (QID) | RECTAL | Status: DC | PRN

## 2012-01-30 MED ORDER — METHOCARBAMOL 500 MG PO TABS
500.0000 mg | ORAL_TABLET | Freq: Four times a day (QID) | ORAL | Status: DC | PRN
  Administered 2012-01-30 – 2012-02-01 (×5): 500 mg via ORAL
  Filled 2012-01-30 (×5): qty 1

## 2012-01-30 MED ORDER — RIVAROXABAN 10 MG PO TABS
10.0000 mg | ORAL_TABLET | Freq: Every day | ORAL | Status: DC
  Administered 2012-01-31 – 2012-02-01 (×2): 10 mg via ORAL
  Filled 2012-01-30 (×2): qty 1

## 2012-01-30 MED ORDER — BACITRACIN-NEOMYCIN-POLYMYXIN OINTMENT TUBE
TOPICAL_OINTMENT | CUTANEOUS | Status: DC | PRN
  Administered 2012-01-30: 1 via TOPICAL

## 2012-01-30 MED ORDER — LACTATED RINGERS IV SOLN: INTRAVENOUS | Status: DC

## 2012-01-30 SURGICAL SUPPLY — 45 items
BAG SPEC THK2 15X12 ZIP CLS (MISCELLANEOUS) ×1
BAG ZIPLOCK 12X15 (MISCELLANEOUS) ×2 IMPLANT
BANDAGE GAUZE ELAST BULKY 4 IN (GAUZE/BANDAGES/DRESSINGS) ×1 IMPLANT
BIT DRILL 2.4X128 (BIT) ×1 IMPLANT
BIT DRILL 2.9 CANN QC NONSTRL (BIT) ×1 IMPLANT
CLOTH BEACON ORANGE TIMEOUT ST (SAFETY) ×2 IMPLANT
CUFF TOURN SGL QUICK 34 (TOURNIQUET CUFF) ×2
CUFF TRNQT CYL 34X4X40X1 (TOURNIQUET CUFF) ×1 IMPLANT
DRAPE C-ARM 42X72 X-RAY (DRAPES) ×2 IMPLANT
DRAPE U-SHAPE 47X51 STRL (DRAPES) ×2 IMPLANT
DRSG ADAPTIC 3X8 NADH LF (GAUZE/BANDAGES/DRESSINGS) ×2 IMPLANT
DRSG PAD ABDOMINAL 8X10 ST (GAUZE/BANDAGES/DRESSINGS) ×3 IMPLANT
DURAPREP 26ML APPLICATOR (WOUND CARE) ×2 IMPLANT
ELECT REM PT RETURN 9FT ADLT (ELECTROSURGICAL) ×2
ELECTRODE REM PT RTRN 9FT ADLT (ELECTROSURGICAL) ×1 IMPLANT
GLOVE BIOGEL PI IND STRL 8 (GLOVE) ×1 IMPLANT
GLOVE BIOGEL PI INDICATOR 8 (GLOVE) ×1
GLOVE ECLIPSE 8.0 STRL XLNG CF (GLOVE) ×3 IMPLANT
GOWN PREVENTION PLUS LG XLONG (DISPOSABLE) ×3 IMPLANT
GOWN STRL REIN XL XLG (GOWN DISPOSABLE) ×3 IMPLANT
K-WIRE ACE 1.6X6 (WIRE) ×4
KWIRE ACE 1.6X6 (WIRE) IMPLANT
MANIFOLD NEPTUNE II (INSTRUMENTS) ×2 IMPLANT
PACK LOWER EXTREMITY WL (CUSTOM PROCEDURE TRAY) ×2 IMPLANT
PAD CAST 4YDX4 CTTN HI CHSV (CAST SUPPLIES) ×2 IMPLANT
PADDING CAST COTTON 4X4 STRL (CAST SUPPLIES) ×2
PADDING CAST SYNTHETIC 4 (CAST SUPPLIES) ×1
PADDING CAST SYNTHETIC 4X4 STR (CAST SUPPLIES) IMPLANT
POSITIONER SURGICAL ARM (MISCELLANEOUS) ×2 IMPLANT
SCOTCHCAST PLUS 3X4 WHITE (CAST SUPPLIES) ×4 IMPLANT
SCREW ACE CAN 4.0 40M (Screw) ×1 IMPLANT
SPONGE GAUZE 4X4 12PLY (GAUZE/BANDAGES/DRESSINGS) ×3 IMPLANT
SPONGE LAP 4X18 X RAY DECT (DISPOSABLE) ×1 IMPLANT
SUT ETHIBOND NAB CT1 #1 30IN (SUTURE) ×1 IMPLANT
SUT ETHILON 3 0 PS 1 (SUTURE) ×3 IMPLANT
SUT VIC AB 0 CT1 27 (SUTURE) ×2
SUT VIC AB 0 CT1 27XBRD ANTBC (SUTURE) ×1 IMPLANT
SUT VIC AB 1 CT1 27 (SUTURE) ×4
SUT VIC AB 1 CT1 27XBRD ANTBC (SUTURE) IMPLANT
SUT VIC AB 2-0 CT1 27 (SUTURE)
SUT VIC AB 2-0 CT1 TAPERPNT 27 (SUTURE) ×1 IMPLANT
TOWEL OR 17X26 10 PK STRL BLUE (TOWEL DISPOSABLE) ×4 IMPLANT
WASHER FLAT ACE (Orthopedic Implant) ×1 IMPLANT
WASHER PLAIN FLAT ACE NS 3PK (Orthopedic Implant) IMPLANT
WATER STERILE IRR 1500ML POUR (IV SOLUTION) ×1 IMPLANT

## 2012-01-30 NOTE — Anesthesia Preprocedure Evaluation (Addendum)
Anesthesia Evaluation  Patient identified by MRN, date of birth, ID band Patient awake    Reviewed: Allergy & Precautions, H&P , NPO status , Patient's Chart, lab work & pertinent test results  Airway Mallampati: II TM Distance: >3 FB Neck ROM: full    Dental No notable dental hx.    Pulmonary neg pulmonary ROS,  breath sounds clear to auscultation  Pulmonary exam normal       Cardiovascular Exercise Tolerance: Good negative cardio ROS  Rhythm:regular Rate:Normal     Neuro/Psych negative neurological ROS  negative psych ROS   GI/Hepatic negative GI ROS, Neg liver ROS,   Endo/Other  negative endocrine ROS  Renal/GU negative Renal ROS  negative genitourinary   Musculoskeletal   Abdominal   Peds  Hematology negative hematology ROS (+)   Anesthesia Other Findings   Reproductive/Obstetrics negative OB ROS                           Anesthesia Physical Anesthesia Plan  ASA: I  Anesthesia Plan: General   Post-op Pain Management:    Induction:   Airway Management Planned:   Additional Equipment:   Intra-op Plan:   Post-operative Plan:   Informed Consent: I have reviewed the patients History and Physical, chart, labs and discussed the procedure including the risks, benefits and alternatives for the proposed anesthesia with the patient or authorized representative who has indicated his/her understanding and acceptance.   Dental Advisory Given  Plan Discussed with: CRNA  Anesthesia Plan Comments:         Anesthesia Quick Evaluation  

## 2012-01-30 NOTE — Preoperative (Signed)
Beta Blockers   Reason not to administer Beta Blockers:Not Applicable 

## 2012-01-30 NOTE — Progress Notes (Signed)
Toes to left foot cool to touch sensation present and limited mobility due to swelling.

## 2012-01-30 NOTE — H&P (View-Only) (Signed)
Orthopedic Tech Progress Note Patient Details:  Darren Russo 09-15-1956 [id number]  Other Ortho Devices Type of Ortho Device: Crutches Ortho Device Location: (L) LE Ortho Device Interventions: Ordered  Type of Splint: Short Leg;Stirrup Splint Location: (L) LE Splint Interventions: Application    [name] 01/25/2012, 8:51 PM

## 2012-01-30 NOTE — Transfer of Care (Signed)
Immediate Anesthesia Transfer of Care Note  Patient: Darren Russo  Procedure(s) Performed: Procedure(s) (LRB): OPEN REDUCTION INTERNAL FIXATION (ORIF) ANKLE FRACTURE (Left)  Patient Location: PACU  Anesthesia Type: General  Level of Consciousness: awake, alert , sedated and patient cooperative  Airway & Oxygen Therapy: Patient Spontanous Breathing and Patient connected to face mask oxygen  Post-op Assessment: Report given to PACU RN and Post -op Vital signs reviewed and stable  Post vital signs: Reviewed and stable  Complications: No apparent anesthesia complications

## 2012-01-30 NOTE — Interval H&P Note (Signed)
History and Physical Interval Note:  01/30/2012 1:07 PM  Darren Russo  has presented today for surgery, with the diagnosis of fracture medial malleolus  left ankle  The various methods of treatment have been discussed with the patient and family. After consideration of risks, benefits and other options for treatment, the patient has consented to  Procedure(s) (LRB): OPEN REDUCTION INTERNAL FIXATION (ORIF) ANKLE FRACTURE (Left) as a surgical intervention .  The patients' history has been reviewed, patient examined, no change in status, stable for surgery.  I have reviewed the patients' chart and labs.  Questions were answered to the patient's satisfaction.     [name] A

## 2012-01-30 NOTE — Anesthesia Postprocedure Evaluation (Signed)
  Anesthesia Post-op Note  Patient: Darren Russo  Procedure(s) Performed: Procedure(s) (LRB): OPEN REDUCTION INTERNAL FIXATION (ORIF) ANKLE FRACTURE (Left)  Patient Location: PACU  Anesthesia Type: General  Level of Consciousness: awake and alert   Airway and Oxygen Therapy: Patient Spontanous Breathing  Post-op Pain: mild  Post-op Assessment: Post-op Vital signs reviewed, Patient's Cardiovascular Status Stable, Respiratory Function Stable, Patent Airway and No signs of Nausea or vomiting  Post-op Vital Signs: stable  Complications: No apparent anesthesia complications

## 2012-01-30 NOTE — Brief Op Note (Signed)
01/30/2012  3:03 PM  PATIENT:  Darren Russo  56 y.o. male  PRE-OPERATIVE DIAGNOSIS:  fracture medial malleolus  left ankle  POST-OPERATIVE DIAGNOSIS:  fracture medial malleolus  left ankle  PROCEDURE:  Procedure(s) (LRB): OPEN REDUCTION INTERNAL FIXATION (ORIF) ANKLE FRACTURE (Left)  SURGEON:  Surgeon(s) and Role:    * Jacki Cones, MD - Primary   ASSISTANTS: Nurse   ANESTHESIA:   general  EBL:  Total I/O In: 1450 [I.V.:1450] Out: 50 [Blood:50]  BLOOD ADMINISTERED:none  DRAINS: none   LOCAL MEDICATIONS USED:  NONE  SPECIMEN:  No Specimen  DISPOSITION OF SPECIMEN:  N/A  COUNTS:  YES  TOURNIQUET:   Total Tourniquet Time Documented: Thigh (Left) - 64 minutes  DICTATION: .Other Dictation: Dictation Number [id number]  PLAN OF CARE: Admit to inpatient   PATIENT DISPOSITION:  PACU - hemodynamically stable.   Delay start of Pharmacological VTE agent (>24hrs) due to surgical blood loss or risk of bleeding: yes

## 2012-01-31 NOTE — Progress Notes (Signed)
Physical Therapy Treatment Patient Details Name: Darren Russo MRN: [id number] DOB: Feb 29, 1956 Today's Date: 01/31/2012 1530-1555  PT Assessment/Plan  PT - Assessment/Plan Comments on Treatment Session: encouragd pt. to elevate LLE above heart when possible. pt plans DC to home tomorrow. Follow Up Recommendations: No PT follow up;Supervision - Intermittent Equipment Recommended: None recommended by PT PT Goals  Acute Rehab PT Goals PT Goal Formulation: With patient Pt will go Sit to Stand: with modified independence PT Goal: Sit to Stand - Progress: Progressing toward goal Pt will go Stand to Sit: with modified independence PT Goal: Stand to Sit - Progress: Progressing toward goal Pt will Ambulate: 51 - 150 feet;with modified independence;with crutches PT Goal: Ambulate - Progress: Progressing toward goal  PT Treatment Precautions/Restrictions  Restrictions Weight Bearing Restrictions: Yes LLE Weight Bearing: Non weight bearing Mobility (including Balance) Bed Mobility Supine to Sit: 5: Supervision Supine to Sit Details (indicate cue type and reason): pt manages to move LLE Sit to Supine: 5: Supervision Sit to Supine - Details (indicate cue type and reason): able to place LLE onto bed Transfers Sit to Stand: 5: Supervision;From bed;With upper extremity assist;From chair/3-in-1 Sit to Stand Details (indicate cue type and reason): pt is steady w/ crutches Stand to Sit: 5: Supervision;To bed;To chair/3-in-1;With upper extremity assist Stand to Sit Details: able to sit down to surface w/ supervision, takes crutches from under arms prior to sitting Ambulation/Gait Ambulation/Gait Assistance: 4: Min assist (minguard) Ambulation/Gait Assistance Details (indicate cue type and reason): tolerated increase in distance w/ no difficulty Ambulation Distance (Feet): 90 Feet Assistive device: Crutches Gait Pattern: Step-to pattern    Exercise    End of Session PT - End of Session Activity  Tolerance: Patient tolerated treatment well Patient left: in bed;with call bell in reach General Behavior During Session: [facility] for tasks performed  Rada Hay 01/31/2012, 4:14 PM

## 2012-01-31 NOTE — Progress Notes (Signed)
UR completed. Darren Russo 01/31/2012  

## 2012-01-31 NOTE — Progress Notes (Signed)
Subjective: Will ambulate today. Circulation intact.    Objective: Vital signs in last 24 hours: Temp:  [97.6 F (36.4 C)-99 F (37.2 C)] 97.6 F (36.4 C) (04/04 0610) Pulse Rate:  [68-90] 78  (04/04 0610) Resp:  [10-20] 17  (04/04 0610) BP: (134-184)/(74-103) 134/81 mmHg (04/04 0610) SpO2:  [96 %-100 %] 100 % (04/04 0610) Weight:  [83.915 kg (185 lb)] 83.915 kg (185 lb) (04/03 1620)  Intake/Output from previous day: 04/03 0701 - 04/04 0700 In: 3535 [P.O.:360; I.V.:3075; IV Piggyback:100] Out: 650 [Urine:600; Blood:50] Intake/Output this shift:     Basename 01/30/12 0940  HGB 14.6    Basename 01/30/12 0940  WBC 8.2  RBC 4.64  HCT 43.5  PLT 258    Basename 01/30/12 0940  NA 141  K 4.1  CL 106  CO2 28  BUN 12  CREATININE 0.86  GLUCOSE 111*  CALCIUM 8.9    Basename 01/30/12 0940  LABPT --  INR 0.95    Neurologically intact Dorsiflexion/Plantar flexion intact  Assessment/Plan: DC tomorrow,on Xarelto   Elayne Gruver A 01/31/2012, 7:53 AM

## 2012-01-31 NOTE — Evaluation (Signed)
Physical Therapy Evaluation Patient Details Name: Darren Russo MRN: [id number] DOB: 1956-10-20 Today's Date: 01/31/2012  Problem List:  Patient Active Problem List  Diagnoses  . Fracture of ankle, bimalleolar, closed    Past Medical History:  Past Medical History  Diagnosis Date  . Hyperlipidemia   . Fracture, ankle     left   Past Surgical History: History reviewed. No pertinent past surgical history.  PT Assessment/Plan/Recommendation PT Assessment Clinical Impression Statement: Pt. is S/P L ankle ORIF, pt tolerated short ambulation w/ crutches inspite of 9/10 pain (pt wanted to amb. to BR). Pt. will benefit from PT to return to modified independent level to DC home. PT Recommendation/Assessment: Patient will need skilled PT in the acute care venue PT Problem List: Decreased activity tolerance;Pain;Decreased knowledge of use of DME PT Therapy Diagnosis : Difficulty walking;Acute pain PT Plan PT Frequency: 7X/week PT Treatment/Interventions: Gait training;DME instruction;Stair training;Functional mobility training PT Recommendation Follow Up Recommendations: No PT follow up;Supervision - Intermittent Equipment Recommended: None recommended by PT PT Goals  Acute Rehab PT Goals PT Goal Formulation: With patient Time For Goal Achievement: 5 days Pt will go Sit to Stand: with modified independence PT Goal: Sit to Stand - Progress: Goal set today Pt will go Stand to Sit: with modified independence PT Goal: Stand to Sit - Progress: Goal set today Pt will Ambulate: 51 - 150 feet;with modified independence;with crutches PT Goal: Ambulate - Progress: Goal set today Pt will Go Up / Down Stairs: 6-9 stairs;with supervision;with crutches PT Goal: Up/Down Stairs - Progress: Goal set today  PT Evaluation Precautions/Restrictions  Restrictions Weight Bearing Restrictions: Yes LLE Weight Bearing: Non weight bearing Prior Functioning  Home Living Lives With: Alone Receives Help  From: Friend(s) (check on him) Type of Home: House Home Layout: Two level;Full bath on main level Alternate Level Stairs-Rails: Right;Left Alternate Level Stairs-Number of Steps: 14 Home Access: Stairs to enter Entrance Stairs-Rails: Left Entrance Stairs-Number of Steps: 2 Home Adaptive Equipment: Crutches Additional Comments: pt states he has been using crutch and rail or scooting up to 2nd level sitting on steps. Prior Function Level of Independence: Independent with basic ADLs;Requires assistive device for independence Able to Take Stairs?: Yes Vocation: Full time employment Cognition Cognition Arousal/Alertness: Awake/alert Overall Cognitive Status: Appears within functional limits for tasks assessed Sensation/Coordination Sensation Light Touch: Appears Intact Extremity Assessment RUE Assessment RUE Assessment: Within Functional Limits LUE Assessment LUE Assessment: Within Functional Limits RLE Assessment RLE Assessment: Within Functional Limits LLE Assessment LLE Assessment:  (able to manage LLE for lifting, moving) Mobility (including Balance) Bed Mobility Bed Mobility: Yes Transfers Transfers: Yes Sit to Stand: 4: Min assist;From bed;From toilet Sit to Stand Details (indicate cue type and reason): steady assist first time standing from bed, mildly unsteady Stand to Sit: 5: Supervision;To toilet;To chair/3-in-1;With upper extremity assist Stand to Sit Details: pt able to remain NWB, reach to chair. Ambulation/Gait Ambulation/Gait: Yes Ambulation/Gait Assistance: 4: Min assist Ambulation/Gait Assistance Details (indicate cue type and reason): steady assist for first time ambulating post-op. pt maintained NWB LLE Ambulation Distance (Feet): 15 Feet (then 25 ) Assistive device: Crutches Gait Pattern: Step-to pattern  Posture/Postural Control Posture/Postural Control: No significant limitations Exercise    End of Session PT - End of Session Activity Tolerance:  Patient tolerated treatment well Patient left: in chair;with call bell in reach Nurse Communication: Mobility status for transfers General Behavior During Session: [facility] for tasks performed  Rada Hay 01/31/2012, 11:24 AM  1030-1100

## 2012-01-31 NOTE — Op Note (Signed)
NAMEDARREON, LUTES NO.:  [id number]  MEDICAL RECORD NO.:  [id number]  LOCATION:  1616                         FACILITY:  [facility]  PHYSICIAN:  Georges Lynch. Elias Bordner, M.D.DATE OF BIRTH:  06/27/1956  DATE OF PROCEDURE:  01/30/2012 DATE OF DISCHARGE:                              OPERATIVE REPORT   PREOPERATIVE DIAGNOSIS:  Bimalleolar fracture dislocation of the left ankle.  POSTOPERATIVE DIAGNOSIS:  Closed bimalleolar fracture dislocation, left ankle.  LITTLE HISTORY:  He was initially seen in the emergency room, so several days before I saw him.  He had a closed fracture dislocation of left ankle.  The dislocation was reduced.  He was splinted and I saw him in my acute care clinic in the office yesterday on Tuesday, 01/29/2012, for the first time.  OPERATION: 1. Open reduction and internal fixation of a displaced medial     malleolar fracture, left ankle.  Operations. 2. Repair of a torn deltoid ligament, left ankle. 3. Closed reduction of the posterior malleolus and application of a     short-leg cast.  PROCEDURE IN DETAIL:  Under general anesthesia, a routine prep and shaving was carried out followed by a sterile prep of the left ankle. Note, at this time, the appropriate time-out was carried out and I also marked the appropriate left leg in the holding area.  The leg was exsanguinated, Esmarch tourniquet was elevated to 325 mmHg.  An incision was made over the medial aspect of the medial malleolus.  The C-arm was used.  At this particular time, great care was taken not to injure the underlying saphenous vein and nerve.  Following this, I went down identified the torn deltoid ligament, this was gently retracted.  I opened the fracture site.  There was a loose body, highlight that in the joint.  I removed that.  I thoroughly irrigated out the joint reapproximated the medial malleolus  first by making a drill hole in the tibia just proximal the fracture site  in order to purchase the towel clip in the bone and hold the fracture reduced.  This time, a guide pin was inserted across the fracture site.  X-rays were taken.  This showed excellent position.  I then measured the pin to be a size 40 mm in length.  I utilized a drill to make my initial drill hole in the distal medial malleolar fragments.  At this time, we used the cannulated drill. I then inserted a 40 mm in length, 4 mm diameter cancellous screw with a washer reduced the fracture and very nicely compressed it with a washer. Several x-rays were taken to make sure the screw was not in the joint, it was not within the anatomical reduction.  I also examined the posterior malleolus.  With simple dorsiflexion of the ankle, the malleolus was reduced, so it was elected not to open the posterior malleolus.  The ankle mortise stem looked fine.  I then irrigated the area out, repaired the deltoid ligament, closed the wound layers in the usual fashion.  We had a nice reduction of his ankle mortise.  I then placed him on a well-padded, short-leg cast that will be  bivalved in the recovery room.  Postop, he will be on Xarelto, a blood thinner.          ______________________________ Georges Lynch. Darrelyn Hillock, M.D.     RAG/MEDQ  D:  01/30/2012  T:  01/31/2012  Job:  [id number]

## 2012-02-01 MED ORDER — METHOCARBAMOL 500 MG PO TABS: 500.0000 mg | ORAL_TABLET | Freq: Four times a day (QID) | ORAL | Status: AC | PRN

## 2012-02-01 MED ORDER — OXYCODONE-ACETAMINOPHEN 5-325 MG PO TABS
1.0000 | ORAL_TABLET | ORAL | Status: AC | PRN
Start: 2012-02-01 — End: 2012-02-11

## 2012-02-01 MED ORDER — RIVAROXABAN 10 MG PO TABS: 10.0000 mg | ORAL_TABLET | Freq: Every day | ORAL | Status: DC

## 2012-02-01 NOTE — Discharge Summary (Signed)
Physician Discharge Summary   Patient ID: KENDRIX ORMAN MRN: [id number] DOB/AGE: June 27, 1956 56 y.o.  Admit date: 01/30/2012 Discharge date: 02/01/2012  Primary Diagnosis: Closed bimalleolar ankle fracture, left  Admission Diagnoses: Past Medical History  Diagnosis Date  . Hyperlipidemia   . Fracture, ankle     left    Discharge Diagnoses:  Principal Problem:  *Fracture of ankle, bimalleolar, closed S/P ORIF bimalleolar left ankle fracture  Procedure: Procedure(s) (LRB): OPEN REDUCTION INTERNAL FIXATION (ORIF) ANKLE FRACTURE (Left)   Consults: None  HPI: The patient is a 56 year old male who presented to Dr. Jeannetta Ellis office with left ankle symptoms including pain and swelling which began 4 days prior following a specific injury in which he hopped a fence and his pants got caught causing him to land badly on his left foot. He was taken to [facility] where xrays were taken. The injury occurred due to a fall while the patient was at home. The patient did not report any radiation of symptoms. He reported that the ankle was dislocated and the ankle was reduced once at the ED.Marland Kitchen He reports that since then he has been nonweightbearing and using crutches. Patient admitted to [facility] to undergo ORIF of left ankle fracture.   Laboratory Data:  [facility] 01/30/12 0940  HGB 14.6    Basename 01/30/12 0940  WBC 8.2  RBC 4.64  HCT 43.5  PLT 258    Basename 01/30/12 0940  NA 141  K 4.1  CL 106  CO2 28  BUN 12  CREATININE 0.86  GLUCOSE 111*  CALCIUM 8.9    Basename 01/30/12 0940  LABPT --  INR 0.95    X-Rays:Dg Tibia/fibula Left  01/25/2012  *RADIOLOGY REPORT*  Clinical Data: Postreduction.  Left lower leg pain, injury.  LEFT TIBIA AND FIBULA - 2 VIEW  Comparison: Left ankle series 01/25/2012  Findings: Interval reduction of the left ankle fracture dislocation.  Mild continued displacement of the medial malleolar fracture and slight displacement of the posterior malleolar  fracture.  Ankle mortise alignment much improved.  There is an oblique fracture through the proximal and midportions of the left fibular shaft.  Approximate 7 mm of anterior displacement of the distal fragment relative to the proximal fragment.  IMPRESSION: Postreduction of the left ankle as above.  Oblique mildly displaced proximal and mid shaft fibular fracture.  Original Report Authenticated By: Cyndie Chime, M.D.   Dg Ankle Complete Left  01/30/2012  *RADIOLOGY REPORT*  Clinical Data: Ankle fracture  LEFT ANKLE COMPLETE - 3+ VIEW  Comparison: 01/25/2012  Findings: Spot images demonstrate the open reduction and internal fixation of a medial malleolus fracture.  Anatomic alignment.  No breakage or loosening of the solitary screw.  IMPRESSION: ORIF medial malleolus fracture.  Original Report Authenticated By: Donavan Burnet, M.D.   Dg Ankle Complete Left  01/25/2012  *RADIOLOGY REPORT*  Clinical Data: Postreduction.  LEFT ANKLE COMPLETE - 3+ VIEW  Comparison: 01/25/2012  Findings: Interval reduction of the left ankle fracture dislocation.  Slight displacement at the medial and posterior malleolar fractures.  Ankle mortise in near anatomic alignment.  IMPRESSION: Interval reduction as above.  Original Report Authenticated By: Cyndie Chime, M.D.   Dg Ankle Complete Left  01/25/2012  *RADIOLOGY REPORT*  Clinical Data: Trauma.  Left ankle pain.  LEFT ANKLE COMPLETE - 3+ VIEW  Comparison: None.  Findings: Medial and posterior malleolar fracture present with posterior dislocation of the talus relative to the tibial plafond. The lateral  malleolus grossly appears intact although there could be avulsion fragments from the lateral malleolus associated with ankle dislocation.  Subtalar joints appear intact.  Talonavicular joint and calcaneal cuboid joint appear intact.  Obvious soft tissue deformity is present.  There is no gas in the soft tissues to suggest open fracture.  IMPRESSION: Medial and posterior  malleolar ankle fracture with tibiotalar dislocation.  Original Report Authenticated By: Andreas Newport, M.D.     Hospital Course: Patient was admitted to [facility] and taken to the OR and underwent the above state procedure without complications.  Patient tolerated the procedure well and was later transferred to the recovery room and then to the orthopaedic floor for postoperative care.  They were given PO and IV analgesics for pain control following their surgery.  He was started on DVT prophylaxis, Xarelto.   PT was ordered for ambulation assistance and training. Discharge planning consulted to help with postop disposition and equipment needs.  Patient had a good night on the evening of surgery and started to get up with therapy on day one.  Continued to progress with therapy into day two. Patient was seen in rounds and was ready to go home.    Discharge Medications: Prior to Admission medications   Medication Sig Start Date End Date Taking? Authorizing Provider  atorvastatin (LIPITOR) 40 MG tablet Take 40 mg by mouth daily.   Yes Historical Provider, MD  methocarbamol (ROBAXIN) 500 MG tablet Take 1 tablet (500 mg total) by mouth every 6 (six) hours as needed. 02/01/12 02/11/12  Tecia Cinnamon Tamala Ser, PA  oxyCODONE-acetaminophen (PERCOCET) 5-325 MG per tablet Take 1-2 tablets by mouth every 4 (four) hours as needed. 02/01/12 02/11/12  Wayland Baik Tamala Ser, PA  rivaroxaban (XARELTO) 10 MG TABS tablet Take 1 tablet (10 mg total) by mouth daily with breakfast. 02/01/12   Amore Ackman Tamala Ser, PA    Diet: heart healthy Activity:NWB Follow-up:in 2 weeks Disposition: Home Discharged Condition: good   Discharge Orders    Future Orders Please Complete By Expires   Diet - low sodium heart healthy      Call MD / Call 911      Comments:   If you experience chest pain or shortness of breath, CALL 911 and be transported to the hospital emergency room.  If you develope a fever above 101 F,  pus (white drainage) or increased drainage or redness at the wound, or calf pain, call your surgeon's office.   Constipation Prevention      Comments:   Drink plenty of fluids.  Prune juice may be helpful.  You may use a stool softener, such as Colace (over the counter) 100 mg twice a day.  Use MiraLax (over the counter) for constipation as needed.   Increase activity slowly as tolerated      Weight Bearing as taught in Physical Therapy      Comments:   Use a walker or crutches as instructed.   Discharge instructions      Comments:   Walk with your walker or crutches Weight bearing as instructed. Change your dressing daily. Shower only, no tub bath. Call if any temperatures greater than 101 or any wound complications: [phone] during the day and ask for Dr. Jeannetta Ellis nurse, Mackey Birchwood.   Driving restrictions      Comments:   No driving     Medication List  As of 02/01/2012  1:46 PM   STOP taking these medications  ibuprofen 200 MG tablet         TAKE these medications         atorvastatin 40 MG tablet   Commonly known as: LIPITOR   Take 40 mg by mouth daily.      methocarbamol 500 MG tablet   Commonly known as: ROBAXIN   Take 1 tablet (500 mg total) by mouth every 6 (six) hours as needed.      oxyCODONE-acetaminophen 5-325 MG per tablet   Commonly known as: PERCOCET   Take 1-2 tablets by mouth every 4 (four) hours as needed.      rivaroxaban 10 MG Tabs tablet   Commonly known as: XARELTO   Take 1 tablet (10 mg total) by mouth daily with breakfast.             Signed: Anslee Micheletti LAUREN 02/01/2012, 1:46 PM

## 2012-02-01 NOTE — Progress Notes (Signed)
Subjective: 2 Days Post-Op Procedure(s) (LRB): OPEN REDUCTION INTERNAL FIXATION (ORIF) ANKLE FRACTURE (Left) Patient reports pain as mild.   Patient seen in rounds with Dr. Darrelyn Hillock. Patient has complaints of mild pain. He reports that he is doing well today. Pain is very well-controlled with his pain meds. He continues to be toe touch weight bearing only. He denies shortness of breath and chest pain. Pt is voiding well. He states that he is ready for discharge today.   Objective: Vital signs in last 24 hours: Temp:  [97.9 F (36.6 C)-99 F (37.2 C)] 97.9 F (36.6 C) (04/05 0507) Pulse Rate:  [81-104] 81  (04/05 0507) Resp:  [16-18] 16  (04/05 0507) BP: (143-158)/(80-93) 148/91 mmHg (04/05 0507) SpO2:  [94 %-98 %] 95 % (04/05 0507)  Intake/Output from previous day:  Intake/Output Summary (Last 24 hours) at 02/01/12 0736 Last data filed at 02/01/12 0557  Gross per 24 hour  Intake   2060 ml  Output   2750 ml  Net   -690 ml     Labs:  Basename 01/30/12 0940  HGB 14.6    Basename 01/30/12 0940  WBC 8.2  RBC 4.64  HCT 43.5  PLT 258    Basename 01/30/12 0940  NA 141  K 4.1  CL 106  CO2 28  BUN 12  CREATININE 0.86  GLUCOSE 111*  CALCIUM 8.9    Basename 01/30/12 0940  LABPT --  INR 0.95    Exam - Neurologically intact Neurovascular intact Dressing - clean, dry Motor function intact - moving toes well on exam.   Past Medical History  Diagnosis Date  . Hyperlipidemia   . Fracture, ankle     left    Assessment/Plan: 2 Days Post-Op Procedure(s) (LRB): OPEN REDUCTION INTERNAL FIXATION (ORIF) ANKLE FRACTURE (Left) Principal Problem:  *Fracture of ankle, bimalleolar, closed   Advance diet Up with therapy toe touch weightbearing only  DC home today   DVT Prophylaxis - Xarelto Protocol Weight-Bearing toe touch only to left leg/foot  Lorriane Dehart LAUREN 02/01/2012, 7:36 AM

## 2012-02-01 NOTE — Progress Notes (Signed)
Physical Therapy Treatment Patient Details Name: Darren Russo MRN: [id number] DOB: 28-Jul-1956 Today's Date: 02/01/2012  PT Assessment/Plan   PT Goals     PT Treatment Precautions/Restrictions  Restrictions Weight Bearing Restrictions: No LLE Weight Bearing: Weight bearing as tolerated Mobility (including Balance)      Exercise    End of Session    Katieann Hungate 02/01/2012, 8:56 AM

## 2012-02-13 ENCOUNTER — Encounter (HOSPITAL_COMMUNITY): Payer: Self-pay | Admitting: Orthopedic Surgery

## 2013-01-29 ENCOUNTER — Encounter (INDEPENDENT_AMBULATORY_CARE_PROVIDER_SITE_OTHER): Payer: Self-pay | Admitting: Surgery

## 2013-02-02 ENCOUNTER — Ambulatory Visit (INDEPENDENT_AMBULATORY_CARE_PROVIDER_SITE_OTHER): Payer: BC Managed Care – PPO | Admitting: Surgery

## 2013-02-02 ENCOUNTER — Encounter (INDEPENDENT_AMBULATORY_CARE_PROVIDER_SITE_OTHER): Payer: Self-pay | Admitting: Surgery

## 2013-02-02 VITALS — BP 156/96 | HR 64 | Temp 97.6°F | Resp 16 | Ht 72.0 in | Wt 191.4 lb

## 2013-02-02 DIAGNOSIS — K429 Umbilical hernia without obstruction or gangrene: Secondary | ICD-10-CM

## 2013-02-02 NOTE — Progress Notes (Signed)
Patient ID: Darren Russo, male   DOB: 01/24/1956, 57 y.o.   MRN: [id number]  Chief Complaint  Patient presents with  . New Evaluation    eval umb hernia    HPI Darren Russo is a 57 y.o. male.  Patient sent at the request of Dr. Mitchell for umbilical hernia. He has had it for 3 years. It is getting larger. It is causing more discomfort. He noticed it over the weekend while lifting. No history of nausea or vomiting. HPI  Past Medical History  Diagnosis Date  . Hyperlipidemia   . Fracture, ankle     left    Past Surgical History  Procedure Laterality Date  . Orif ankle fracture  01/30/2012    Procedure: OPEN REDUCTION INTERNAL FIXATION (ORIF) ANKLE FRACTURE;  Surgeon: Ronald A Gioffre, MD;  Location: [facility];  Service: Orthopedics;  Laterality: Left;    Family History  Problem Relation Age of Onset  . Cancer Mother     breast  . Heart disease Father   . Cancer Maternal Grandfather     throat    Social History History  Substance Use Topics  . Smoking status: Never Smoker   . Smokeless tobacco: Not on file  . Alcohol Use: 7.2 oz/week    12 Cans of beer per week     Comment: occasional/week    No Known Allergies  Current Outpatient Prescriptions  Medication Sig Dispense Refill  . atorvastatin (LIPITOR) 40 MG tablet Take 40 mg by mouth daily.       No current facility-administered medications for this visit.    Review of Systems Review of Systems  Constitutional: Negative for fever, chills and unexpected weight change.  HENT: Negative for hearing loss, congestion, sore throat, trouble swallowing and voice change.   Eyes: Negative for visual disturbance.  Respiratory: Negative for cough and wheezing.   Cardiovascular: Negative for chest pain, palpitations and leg swelling.  Gastrointestinal: Negative for nausea, vomiting, abdominal pain, diarrhea, constipation, blood in stool, abdominal distention, anal bleeding and rectal pain.  Genitourinary: Negative for hematuria  and difficulty urinating.  Musculoskeletal: Negative for arthralgias.  Skin: Negative for rash and wound.  Neurological: Negative for seizures, syncope, weakness and headaches.  Hematological: Negative for adenopathy. Does not bruise/bleed easily.  Psychiatric/Behavioral: Negative for confusion.    Blood pressure 156/96, pulse 64, temperature 97.6 F (36.4 C), temperature source Temporal, resp. rate 16, height 6' (1.829 m), weight 191 lb 6.4 oz (86.818 kg), SpO2 98.00%.  Physical Exam Physical Exam  Constitutional: He is oriented to person, place, and time. He appears well-developed and well-nourished.  HENT:  Head: Normocephalic and atraumatic.  Eyes: EOM are normal. Pupils are equal, round, and reactive to light.  Neck: Normal range of motion. Neck supple.  Cardiovascular: Normal rate and regular rhythm.   Pulmonary/Chest: Effort normal and breath sounds normal.  Abdominal: Soft. Bowel sounds are normal. A hernia is present.    Musculoskeletal: Normal range of motion.  Neurological: He is alert and oriented to person, place, and time.  Skin: Skin is warm and dry.  Psychiatric: He has a normal mood and affect. His behavior is normal. Judgment and thought content normal.      Assessment    Umbilical hernia reducible but symptomatic    Plan    Repair of umbilical hernia.The risk of hernia repair include bleeding,  Infection,   Recurrence of the hernia,  Mesh use, chronic pain,  Organ injury,  Bowel injury,  Bladder   injury,   nerve injury with numbness around the incision,  Death,  and worsening of preexisting  medical problems.  The alternatives to surgery have been discussed as well..  Long term expectations of both operative and non operative treatments have been discussed.   The patient agrees to proceed.       Armanii Pressnell A. 02/02/2013, 3:46 PM    

## 2013-02-02 NOTE — Patient Instructions (Signed)
Hernia A hernia occurs when an internal organ pushes out through a weak spot in the abdominal wall. Hernias most commonly occur in the groin and around the navel. Hernias often can be pushed back into place (reduced). Most hernias tend to get worse over time. Some abdominal hernias can get stuck in the opening (irreducible or incarcerated hernia) and cannot be reduced. An irreducible abdominal hernia which is tightly squeezed into the opening is at risk for impaired blood supply (strangulated hernia). A strangulated hernia is a medical emergency. Because of the risk for an irreducible or strangulated hernia, surgery may be recommended to repair a hernia. CAUSES   Heavy lifting.  Prolonged coughing.  Straining to have a bowel movement.  A cut (incision) made during an abdominal surgery. HOME CARE INSTRUCTIONS   Bed rest is not required. You may continue your normal activities.  Avoid lifting more than 10 pounds (4.5 kg) or straining.  Cough gently. If you are a smoker it is best to stop. Even the best hernia repair can break down with the continual strain of coughing. Even if you do not have your hernia repaired, a cough will continue to aggravate the problem.  Do not wear anything tight over your hernia. Do not try to keep it in with an outside bandage or truss. These can damage abdominal contents if they are trapped within the hernia sac.  Eat a normal diet.  Avoid constipation. Straining over long periods of time will increase hernia size and encourage breakdown of repairs. If you cannot do this with diet alone, stool softeners may be used. SEEK IMMEDIATE MEDICAL CARE IF:   You have a fever.  You develop increasing abdominal pain.  You feel nauseous or vomit.  Your hernia is stuck outside the abdomen, looks discolored, feels hard, or is tender.  You have any changes in your bowel habits or in the hernia that are unusual for you.  You have increased pain or swelling around the  hernia.  You cannot push the hernia back in place by applying gentle pressure while lying down. MAKE SURE YOU:   Understand these instructions.  Will watch your condition.  Will get help right away if you are not doing well or get worse. Document Released: 10/15/2005 Document Revised: 01/07/2012 Document Reviewed: 06/03/2008 [employer] Patient Information 2013 [employer].  

## 2013-02-06 ENCOUNTER — Encounter (HOSPITAL_COMMUNITY): Payer: Self-pay | Admitting: Pharmacy Technician

## 2013-02-10 NOTE — Patient Instructions (Signed)
Darren Russo  02/10/2013   Your procedure is scheduled on:  02/18/13   Report to [facility] at   0630  AM.  Call this number if you have problems the morning of surgery: [phone]   Remember:   Do not eat food or drink liquids after midnight.   Take these medicines the morning of surgery with A SIP OF WATER:    Do not wear jewelry, ake-up or nail polish.  Do not wear lotions, powders, or perfumes.   . Men may shave face and neck.  Do not bring valuables to the hospital.  Contacts, dentures or bridgework may not be worn into surgery.       Patients discharged the day of surgery will not be allowed to drive  home.  Name and phone number of your driver:    SEE CHG INSTRUCTION SHEET    Please read over the following fact sheets that you were given: MRSA Information, coughing and deep breathing exercises, leg exercises               Failure to comply with these instructions may result in cancellation of your surgery.                Patient Signature ____________________________              Nurse Signature _____________________________

## 2013-02-11 ENCOUNTER — Encounter (HOSPITAL_COMMUNITY): Payer: Self-pay

## 2013-02-11 ENCOUNTER — Other Ambulatory Visit (HOSPITAL_COMMUNITY): Payer: BC Managed Care – PPO

## 2013-02-11 ENCOUNTER — Encounter (HOSPITAL_COMMUNITY)
Admission: RE | Admit: 2013-02-11 | Discharge: 2013-02-11 | Disposition: A | Discharge status: Home / Self Care | Payer: BC Managed Care – PPO | Source: Ambulatory Visit | Attending: Surgery | Admitting: Surgery

## 2013-02-11 LAB — CBC
MCH: 31.2 pg (ref 26.0–34.0)
Platelets: 281 10*3/uL (ref 150–400)
RBC: 5.07 MIL/uL (ref 4.22–5.81)
WBC: 6.9 10*3/uL (ref 4.0–10.5)

## 2013-02-11 LAB — SURGICAL PCR SCREEN
MRSA, PCR: NEGATIVE
Staphylococcus aureus: NEGATIVE

## 2013-02-18 ENCOUNTER — Encounter (HOSPITAL_COMMUNITY)
Admission: RE | Disposition: A | Discharge status: Home / Self Care | Payer: Self-pay | Source: Ambulatory Visit | Attending: Surgery

## 2013-02-18 ENCOUNTER — Ambulatory Visit (HOSPITAL_COMMUNITY): Payer: BC Managed Care – PPO | Admitting: Certified Registered"

## 2013-02-18 ENCOUNTER — Ambulatory Visit (HOSPITAL_COMMUNITY)
Admission: RE | Admit: 2013-02-18 | Discharge: 2013-02-18 | Disposition: A | Discharge status: Home / Self Care | Payer: BC Managed Care – PPO | Source: Ambulatory Visit | Attending: Surgery | Admitting: Surgery

## 2013-02-18 ENCOUNTER — Encounter (HOSPITAL_COMMUNITY): Payer: Self-pay | Admitting: *Deleted

## 2013-02-18 ENCOUNTER — Encounter (HOSPITAL_COMMUNITY): Payer: Self-pay | Admitting: Certified Registered"

## 2013-02-18 DIAGNOSIS — K420 Umbilical hernia with obstruction, without gangrene: Secondary | ICD-10-CM | POA: Insufficient documentation

## 2013-02-18 DIAGNOSIS — E785 Hyperlipidemia, unspecified: Secondary | ICD-10-CM | POA: Insufficient documentation

## 2013-02-18 DIAGNOSIS — Z9889 Other specified postprocedural states: Secondary | ICD-10-CM

## 2013-02-18 HISTORY — PX: UMBILICAL HERNIA REPAIR: SHX196

## 2013-02-18 SURGERY — REPAIR, HERNIA, UMBILICAL, ADULT
Anesthesia: General | Site: Abdomen | Wound class: Clean

## 2013-02-18 MED ORDER — GLYCOPYRROLATE 0.2 MG/ML IJ SOLN
INTRAMUSCULAR | Status: DC | PRN
  Administered 2013-02-18: 0.6 mg via INTRAVENOUS

## 2013-02-18 MED ORDER — CHLORHEXIDINE GLUCONATE 4 % EX LIQD: 1.0000 "application " | Freq: Once | CUTANEOUS | Status: DC

## 2013-02-18 MED ORDER — CEFAZOLIN SODIUM-DEXTROSE 2-3 GM-% IV SOLR
2.0000 g | INTRAVENOUS | Status: AC
  Administered 2013-02-18: 2 g via INTRAVENOUS

## 2013-02-18 MED ORDER — ONDANSETRON HCL 4 MG/2ML IJ SOLN
INTRAMUSCULAR | Status: DC | PRN
  Administered 2013-02-18: 4 mg via INTRAVENOUS

## 2013-02-18 MED ORDER — MIDAZOLAM HCL 5 MG/5ML IJ SOLN
INTRAMUSCULAR | Status: DC | PRN
  Administered 2013-02-18: 2 mg via INTRAVENOUS

## 2013-02-18 MED ORDER — LACTATED RINGERS IV SOLN: INTRAVENOUS | Status: DC

## 2013-02-18 MED ORDER — ACETAMINOPHEN 10 MG/ML IV SOLN
INTRAVENOUS | Status: AC
  Filled 2013-02-18: qty 100

## 2013-02-18 MED ORDER — PROMETHAZINE HCL 25 MG/ML IJ SOLN: 6.2500 mg | INTRAMUSCULAR | Status: DC | PRN

## 2013-02-18 MED ORDER — ACETAMINOPHEN 10 MG/ML IV SOLN
INTRAVENOUS | Status: DC | PRN
  Administered 2013-02-18: 1000 mg via INTRAVENOUS

## 2013-02-18 MED ORDER — ROCURONIUM BROMIDE 100 MG/10ML IV SOLN
INTRAVENOUS | Status: DC | PRN
  Administered 2013-02-18: 40 mg via INTRAVENOUS

## 2013-02-18 MED ORDER — FENTANYL CITRATE 0.05 MG/ML IJ SOLN: 25.0000 ug | INTRAMUSCULAR | Status: DC | PRN

## 2013-02-18 MED ORDER — CEFAZOLIN SODIUM-DEXTROSE 2-3 GM-% IV SOLR
INTRAVENOUS | Status: AC
  Filled 2013-02-18: qty 50

## 2013-02-18 MED ORDER — HYDROCODONE-ACETAMINOPHEN 5-325 MG PO TABS: 2.0000 | ORAL_TABLET | ORAL | Status: DC | PRN

## 2013-02-18 MED ORDER — LIDOCAINE HCL (CARDIAC) 20 MG/ML IV SOLN
INTRAVENOUS | Status: DC | PRN
  Administered 2013-02-18: 50 mg via INTRAVENOUS

## 2013-02-18 MED ORDER — BUPIVACAINE-EPINEPHRINE 0.25% -1:200000 IJ SOLN
INTRAMUSCULAR | Status: AC
  Filled 2013-02-18: qty 1

## 2013-02-18 MED ORDER — FENTANYL CITRATE 0.05 MG/ML IJ SOLN
INTRAMUSCULAR | Status: DC | PRN
  Administered 2013-02-18: 100 ug via INTRAVENOUS
  Administered 2013-02-18: 50 ug via INTRAVENOUS
  Administered 2013-02-18: 100 ug via INTRAVENOUS

## 2013-02-18 MED ORDER — MEPERIDINE HCL 50 MG/ML IJ SOLN: 6.2500 mg | INTRAMUSCULAR | Status: DC | PRN

## 2013-02-18 MED ORDER — LACTATED RINGERS IV SOLN
INTRAVENOUS | Status: DC
  Administered 2013-02-18: 1000 mL via INTRAVENOUS
  Administered 2013-02-18: 09:00:00 via INTRAVENOUS

## 2013-02-18 MED ORDER — PROPOFOL 10 MG/ML IV BOLUS
INTRAVENOUS | Status: DC | PRN
  Administered 2013-02-18: 180 mg via INTRAVENOUS

## 2013-02-18 MED ORDER — EPHEDRINE SULFATE 50 MG/ML IJ SOLN
INTRAMUSCULAR | Status: DC | PRN
  Administered 2013-02-18: 10 mg via INTRAVENOUS

## 2013-02-18 MED ORDER — BUPIVACAINE-EPINEPHRINE 0.25% -1:200000 IJ SOLN
INTRAMUSCULAR | Status: DC | PRN
  Administered 2013-02-18: 35 mL

## 2013-02-18 MED ORDER — 0.9 % SODIUM CHLORIDE (POUR BTL) OPTIME
TOPICAL | Status: DC | PRN
  Administered 2013-02-18: 1000 mL

## 2013-02-18 MED ORDER — NEOSTIGMINE METHYLSULFATE 1 MG/ML IJ SOLN
INTRAMUSCULAR | Status: DC | PRN
  Administered 2013-02-18: 4 mg via INTRAVENOUS

## 2013-02-18 SURGICAL SUPPLY — 35 items
ADH SKN CLS APL DERMABOND .7 (GAUZE/BANDAGES/DRESSINGS) ×4
APL SKNCLS STERI-STRIP NONHPOA (GAUZE/BANDAGES/DRESSINGS) ×2
BENZOIN TINCTURE PRP APPL 2/3 (GAUZE/BANDAGES/DRESSINGS) ×3 IMPLANT
BLADE HEX COATED 2.75 (ELECTRODE) ×3 IMPLANT
CANISTER SUCTION 2500CC (MISCELLANEOUS) ×3 IMPLANT
CLOTH BEACON ORANGE TIMEOUT ST (SAFETY) ×3 IMPLANT
DECANTER SPIKE VIAL GLASS SM (MISCELLANEOUS) ×3 IMPLANT
DERMABOND ADVANCED (GAUZE/BANDAGES/DRESSINGS) ×2
DERMABOND ADVANCED .7 DNX12 (GAUZE/BANDAGES/DRESSINGS) ×2 IMPLANT
DRAPE LAPAROTOMY T 102X78X121 (DRAPES) ×3 IMPLANT
DRSG TEGADERM 4X4.75 (GAUZE/BANDAGES/DRESSINGS) IMPLANT
ELECT REM PT RETURN 9FT ADLT (ELECTROSURGICAL) ×3
ELECTRODE REM PT RTRN 9FT ADLT (ELECTROSURGICAL) ×2 IMPLANT
GLOVE BIOGEL PI IND STRL 7.0 (GLOVE) ×2 IMPLANT
GLOVE BIOGEL PI INDICATOR 7.0 (GLOVE) ×1
GLOVE INDICATOR 8.0 STRL GRN (GLOVE) ×6 IMPLANT
GLOVE SS BIOGEL STRL SZ 8 (GLOVE) ×2 IMPLANT
GLOVE SUPERSENSE BIOGEL SZ 8 (GLOVE) ×1
GOWN STRL NON-REIN LRG LVL3 (GOWN DISPOSABLE) ×3 IMPLANT
GOWN STRL REIN XL XLG (GOWN DISPOSABLE) ×6 IMPLANT
KIT BASIN OR (CUSTOM PROCEDURE TRAY) ×3 IMPLANT
NEEDLE HYPO 22GX1.5 SAFETY (NEEDLE) ×3 IMPLANT
NS IRRIG 1000ML POUR BTL (IV SOLUTION) ×3 IMPLANT
PACK GENERAL/GYN (CUSTOM PROCEDURE TRAY) ×3 IMPLANT
SPONGE GAUZE 4X4 12PLY (GAUZE/BANDAGES/DRESSINGS) ×3 IMPLANT
SPONGE LAP 18X18 X RAY DECT (DISPOSABLE) IMPLANT
STRIP CLOSURE SKIN 1/2X4 (GAUZE/BANDAGES/DRESSINGS) IMPLANT
SUT MNCRL AB 4-0 PS2 18 (SUTURE) ×3 IMPLANT
SUT NOVA 1 T20/GS 25DT (SUTURE) IMPLANT
SUT NOVA NAB DX-16 0-1 5-0 T12 (SUTURE) ×2 IMPLANT
SUT NOVA NAB GS-21 1 T12 (SUTURE) ×2 IMPLANT
SUT PROLENE 0 CT 1 30 (SUTURE) IMPLANT
SUT PROLENE 0 CT 1 CR/8 (SUTURE) IMPLANT
SYR CONTROL 10ML LL (SYRINGE) ×1 IMPLANT
TOWEL OR 17X26 10 PK STRL BLUE (TOWEL DISPOSABLE) ×3 IMPLANT

## 2013-02-18 NOTE — Anesthesia Preprocedure Evaluation (Signed)
Anesthesia Evaluation  Patient identified by MRN, date of birth, ID band Patient awake    Reviewed: Allergy & Precautions, H&P , NPO status , Patient's Chart, lab work & pertinent test results  Airway Mallampati: II TM Distance: >3 FB Neck ROM: full    Dental no notable dental hx. (+) Teeth Intact   Pulmonary neg pulmonary ROS,  breath sounds clear to auscultation  Pulmonary exam normal       Cardiovascular Exercise Tolerance: Good negative cardio ROS  Rhythm:regular Rate:Normal     Neuro/Psych negative neurological ROS  negative psych ROS   GI/Hepatic negative GI ROS, Neg liver ROS,   Endo/Other  negative endocrine ROS  Renal/GU negative Renal ROS  negative genitourinary   Musculoskeletal   Abdominal   Peds  Hematology negative hematology ROS (+)   Anesthesia Other Findings   Reproductive/Obstetrics negative OB ROS                           Anesthesia Physical Anesthesia Plan  ASA: I  Anesthesia Plan: General   Post-op Pain Management:    Induction:   Airway Management Planned:   Additional Equipment:   Intra-op Plan:   Post-operative Plan:   Informed Consent: I have reviewed the patients History and Physical, chart, labs and discussed the procedure including the risks, benefits and alternatives for the proposed anesthesia with the patient or authorized representative who has indicated his/her understanding and acceptance.   Dental Advisory Given  Plan Discussed with: CRNA  Anesthesia Plan Comments:         Anesthesia Quick Evaluation

## 2013-02-18 NOTE — Transfer of Care (Signed)
Immediate Anesthesia Transfer of Care Note  Patient: Darren Russo  Procedure(s) Performed: Procedure(s): HERNIA REPAIR UMBILICAL ADULT (N/A)  Patient Location: PACU  Anesthesia Type:General  Level of Consciousness: awake, alert  and oriented  Airway & Oxygen Therapy: Patient Spontanous Breathing and Patient connected to face mask oxygen  Post-op Assessment: Report given to PACU RN and Post -op Vital signs reviewed and stable  Post vital signs: Reviewed and stable  Complications: No apparent anesthesia complications

## 2013-02-18 NOTE — Anesthesia Postprocedure Evaluation (Signed)
  Anesthesia Post-op Note  Patient: Darren Russo  Procedure(s) Performed: Procedure(s) (LRB): HERNIA REPAIR UMBILICAL ADULT (N/A)  Patient Location: PACU  Anesthesia Type: General  Level of Consciousness: awake and alert   Airway and Oxygen Therapy: Patient Spontanous Breathing  Post-op Pain: mild  Post-op Assessment: Post-op Vital signs reviewed, Patient's Cardiovascular Status Stable, Respiratory Function Stable, Patent Airway and No signs of Nausea or vomiting  Last Vitals:  Filed Vitals:   02/18/13 1011  BP:   Pulse:   Temp: 36.4 C  Resp:     Post-op Vital Signs: stable   Complications: No apparent anesthesia complications

## 2013-02-18 NOTE — Interval H&P Note (Signed)
History and Physical Interval Note:  02/18/2013 8:14 AM  Darren Russo  has presented today for surgery, with the diagnosis of umbilical hernia  The various methods of treatment have been discussed with the patient and family. After consideration of risks, benefits and other options for treatment, the patient has consented to  Procedure(s): HERNIA REPAIR UMBILICAL ADULT (N/A) INSERTION OF MESH (N/A) as a surgical intervention .  The patient's history has been reviewed, patient examined, no change in status, stable for surgery.  I have reviewed the patient's chart and labs.  Questions were answered to the patient's satisfaction.     Jeanie Mccard A.

## 2013-02-18 NOTE — Brief Op Note (Signed)
02/18/2013  9:25 AM  PATIENT:  Arna Medici  57 y.o. male  PRE-OPERATIVE DIAGNOSIS:  umbilical hernia  POST-OPERATIVE DIAGNOSIS:  umbilical hernia  PROCEDURE:  Procedure(s): HERNIA REPAIR UMBILICAL ADULT (N/A)  SURGEON:  Surgeon(s) and Role:    * Mykelti Goldenstein A. Sky Primo, MD - Primary     ASSISTANTS: none   ANESTHESIA:   local and general  EBL:  Total I/O In: 1000 [I.V.:1000] Out: -   BLOOD ADMINISTERED:none  DRAINS: none   LOCAL MEDICATIONS USED:  BUPIVICAINE   SPECIMEN:  No Specimen  DISPOSITION OF SPECIMEN:  N/A  COUNTS:  YES  TOURNIQUET:  * No tourniquets in log *  DICTATION: .Other Dictation: Dictation Number [id number]  PLAN OF CARE: Discharge to home after PACU  PATIENT DISPOSITION:  PACU - hemodynamically stable.   Delay start of Pharmacological VTE agent (>24hrs) due to surgical blood loss or risk of bleeding: not applicable

## 2013-02-18 NOTE — H&P (View-Only) (Signed)
Patient ID: Darren Russo, male   DOB: [birth year], 57 y.o.   MRN: [id number]  Chief Complaint  Patient presents with  . New Evaluation    eval umb hernia    HPI Darren Russo is a 57 y.o. male.  Patient sent at the request of Dr. Clovis Riley for umbilical hernia. He has had it for 3 years. It is getting larger. It is causing more discomfort. He noticed it over the weekend while lifting. No history of nausea or vomiting. HPI  Past Medical History  Diagnosis Date  . Hyperlipidemia   . Fracture, ankle     left    Past Surgical History  Procedure Laterality Date  . Orif ankle fracture  01/30/2012    Procedure: OPEN REDUCTION INTERNAL FIXATION (ORIF) ANKLE FRACTURE;  Surgeon: Jacki Cones, MD;  Location: [facility];  Service: Orthopedics;  Laterality: Left;    Family History  Problem Relation Age of Onset  . Cancer Mother     breast  . Heart disease Father   . Cancer Maternal Grandfather     throat    Social History History  Substance Use Topics  . Smoking status: Never Smoker   . Smokeless tobacco: Not on file  . Alcohol Use: 7.2 oz/week    12 Cans of beer per week     Comment: occasional/week    No Known Allergies  Current Outpatient Prescriptions  Medication Sig Dispense Refill  . atorvastatin (LIPITOR) 40 MG tablet Take 40 mg by mouth daily.       No current facility-administered medications for this visit.    Review of Systems Review of Systems  Constitutional: Negative for fever, chills and unexpected weight change.  HENT: Negative for hearing loss, congestion, sore throat, trouble swallowing and voice change.   Eyes: Negative for visual disturbance.  Respiratory: Negative for cough and wheezing.   Cardiovascular: Negative for chest pain, palpitations and leg swelling.  Gastrointestinal: Negative for nausea, vomiting, abdominal pain, diarrhea, constipation, blood in stool, abdominal distention, anal bleeding and rectal pain.  Genitourinary: Negative for hematuria  and difficulty urinating.  Musculoskeletal: Negative for arthralgias.  Skin: Negative for rash and wound.  Neurological: Negative for seizures, syncope, weakness and headaches.  Hematological: Negative for adenopathy. Does not bruise/bleed easily.  Psychiatric/Behavioral: Negative for confusion.    Blood pressure 156/96, pulse 64, temperature 97.6 F (36.4 C), temperature source Temporal, resp. rate 16, height 6' (1.829 m), weight 191 lb 6.4 oz (86.818 kg), SpO2 98.00%.  Physical Exam Physical Exam  Constitutional: He is oriented to person, place, and time. He appears well-developed and well-nourished.  HENT:  Head: Normocephalic and atraumatic.  Eyes: EOM are normal. Pupils are equal, round, and reactive to light.  Neck: Normal range of motion. Neck supple.  Cardiovascular: Normal rate and regular rhythm.   Pulmonary/Chest: Effort normal and breath sounds normal.  Abdominal: Soft. Bowel sounds are normal. A hernia is present.    Musculoskeletal: Normal range of motion.  Neurological: He is alert and oriented to person, place, and time.  Skin: Skin is warm and dry.  Psychiatric: He has a normal mood and affect. His behavior is normal. Judgment and thought content normal.      Assessment    Umbilical hernia reducible but symptomatic    Plan    Repair of umbilical hernia.The risk of hernia repair include bleeding,  Infection,   Recurrence of the hernia,  Mesh use, chronic pain,  Organ injury,  Bowel injury,  Bladder  injury,   nerve injury with numbness around the incision,  Death,  and worsening of preexisting  medical problems.  The alternatives to surgery have been discussed as well..  Long term expectations of both operative and non operative treatments have been discussed.   The patient agrees to proceed.       Shermeka Rutt A. 02/02/2013, 3:46 PM

## 2013-02-18 NOTE — Op Note (Signed)
NAMECALIX, Darren Russo NO.:  [id number]  MEDICAL RECORD NO.:  [id number]  LOCATION:  [facility]                         FACILITY:  [facility]  PHYSICIAN:  Maisie Fus A. Lorenza Shakir, M.D.DATE OF BIRTH:  Oct 05, 1956  DATE OF PROCEDURE:  02/18/2013 DATE OF DISCHARGE:  02/18/2013                              OPERATIVE REPORT   PREOPERATIVE DIAGNOSIS:  Umbilical hernia, incarcerated.  POSTOPERATIVE DIAGNOSIS:  Umbilical hernia, incarcerated measuring approximately 1 cm.  PROCEDURE:  Primary repair of umbilical hernia measuring 1 cm.  ANESTHESIA:  Maisie Fus A. Persia Lintner, M.D.  ANESTHESIA:  General endotracheal anesthesia of 0.25% Sensorcaine local with epinephrine.  ESTIMATED BLOOD LOSS:  Minimal.  SPECIMENS:  None.  DRAINS:  None.  IV FLUIDS:  Approximately 1 L of crystalloid.  INDICATIONS FOR PROCEDURE:  The patient is a 57 year old male with painful umbilical hernia.  It has been present and causing more discomfort.  He has had some preperitoneal fat trapped in it and wished to have it repaired.  We discussed the operation and the possible use of mesh, the risk of bleeding, infection, and injury to intestine and were obstruction.  We discussed recurrence rates with him without mesh in this if mesh was not used, the rationale for that.  I explained all this to home, described the pathophysiology of umbilical hernias, and he wished to proceed with open repair.  DESCRIPTION OF PROCEDURE:  The patient was met in the holding area. Questions were answered.  He was taken back to the operating room. After induction of general endotracheal anesthesia, his abdomen was prepped and draped in a sterile fashion.  He received 2 g of Ancef. Time-out was done.  Curvilinear incision was made along the inferior aspect of the umbilicus.  Dissection was carried down.  The hernia sac was dissected off the undersurface of the umbilical skin.  There was a 1- cm defect.  I placed my finger through the  defect.  It measured roughly 1 cm or a little bit less than that.  This was preperitoneal fat that reduced back in the preperitoneal space.  It was incarcerated.  I decided to close the defect with #1 Novafil pop offs.  Three sutures were used.  This closed nicely.  We then secured the umbilicus to the skin with a 2-0 Vicryl.  The 4-0 Monocryl was used to close the skin in subcuticular fashion.  Dermabond applied.  All final counts of sponge, needle, instruments were found to be correct for this portion of the case.  The patient was awoke, extubated, taken to recovery in satisfactory condition.  All counts were found to be correct.     Maisie Hauser A. Carollynn Pennywell, M.D.     TAC/MEDQ  D:  02/18/2013  T:  02/18/2013  Job:  [id number]

## 2013-02-19 ENCOUNTER — Encounter (HOSPITAL_COMMUNITY): Payer: Self-pay | Admitting: Surgery

## 2013-02-23 ENCOUNTER — Encounter (INDEPENDENT_AMBULATORY_CARE_PROVIDER_SITE_OTHER): Payer: Self-pay

## 2013-03-09 ENCOUNTER — Ambulatory Visit (INDEPENDENT_AMBULATORY_CARE_PROVIDER_SITE_OTHER): Payer: BC Managed Care – PPO | Admitting: Surgery

## 2013-03-09 ENCOUNTER — Encounter (INDEPENDENT_AMBULATORY_CARE_PROVIDER_SITE_OTHER): Payer: Self-pay | Admitting: Surgery

## 2013-03-09 VITALS — BP 126/82 | HR 74 | Temp 97.3°F | Resp 16 | Ht 72.0 in | Wt 190.2 lb

## 2013-03-09 DIAGNOSIS — Z9889 Other specified postprocedural states: Secondary | ICD-10-CM

## 2013-03-09 NOTE — Progress Notes (Signed)
Pt returns today after  Umbilical hernia repair.  Pain is well controlled.  Bowels are functioning.  Wound is clean.  On exam:  Incision is clean /dry/intact.  Area is soft without signs of hernia recurrence.  Impression:  Status repair of hernia umbilical   Plan:  RTC PRN  Return to work in 1   Weeks.

## 2013-03-09 NOTE — Patient Instructions (Signed)
Light duty 1 week.

## 2013-08-13 IMAGING — CR DG TIBIA/FIBULA 2V*L*
4 series · 4 of 4 positions shown · non-contrast
Comparison: Left ankle series 01/25/2012

CLINICAL DATA: Postreduction.  Left lower leg pain, injury.

LEFT TIBIA AND FIBULA - 2 VIEW

[x tib-fib lat left (1 of 2)]
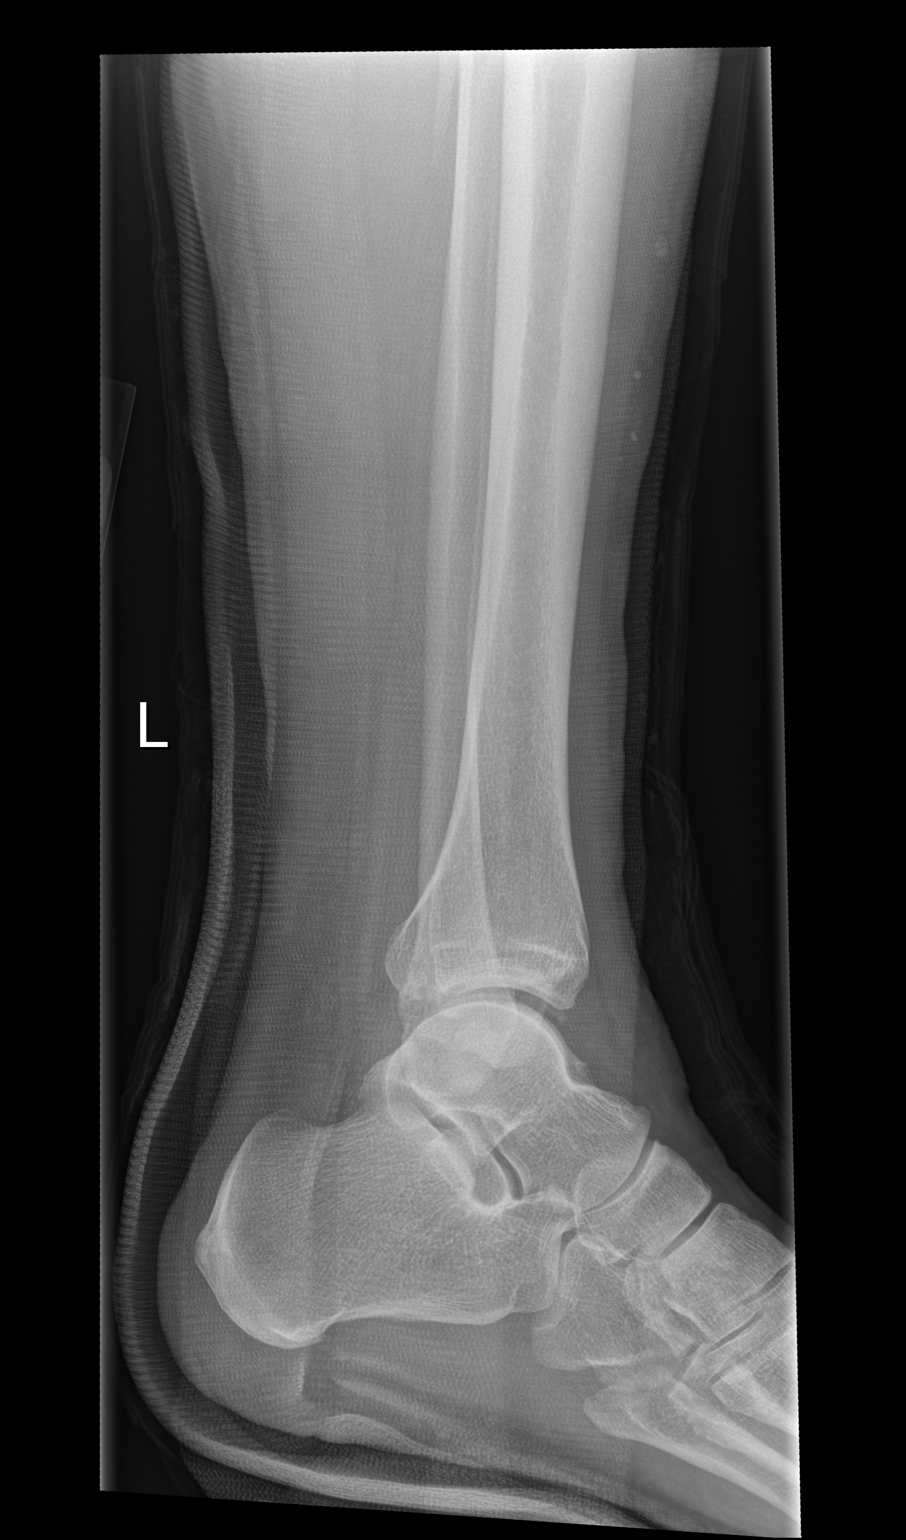

[x tib-fib lat left (2 of 2)]
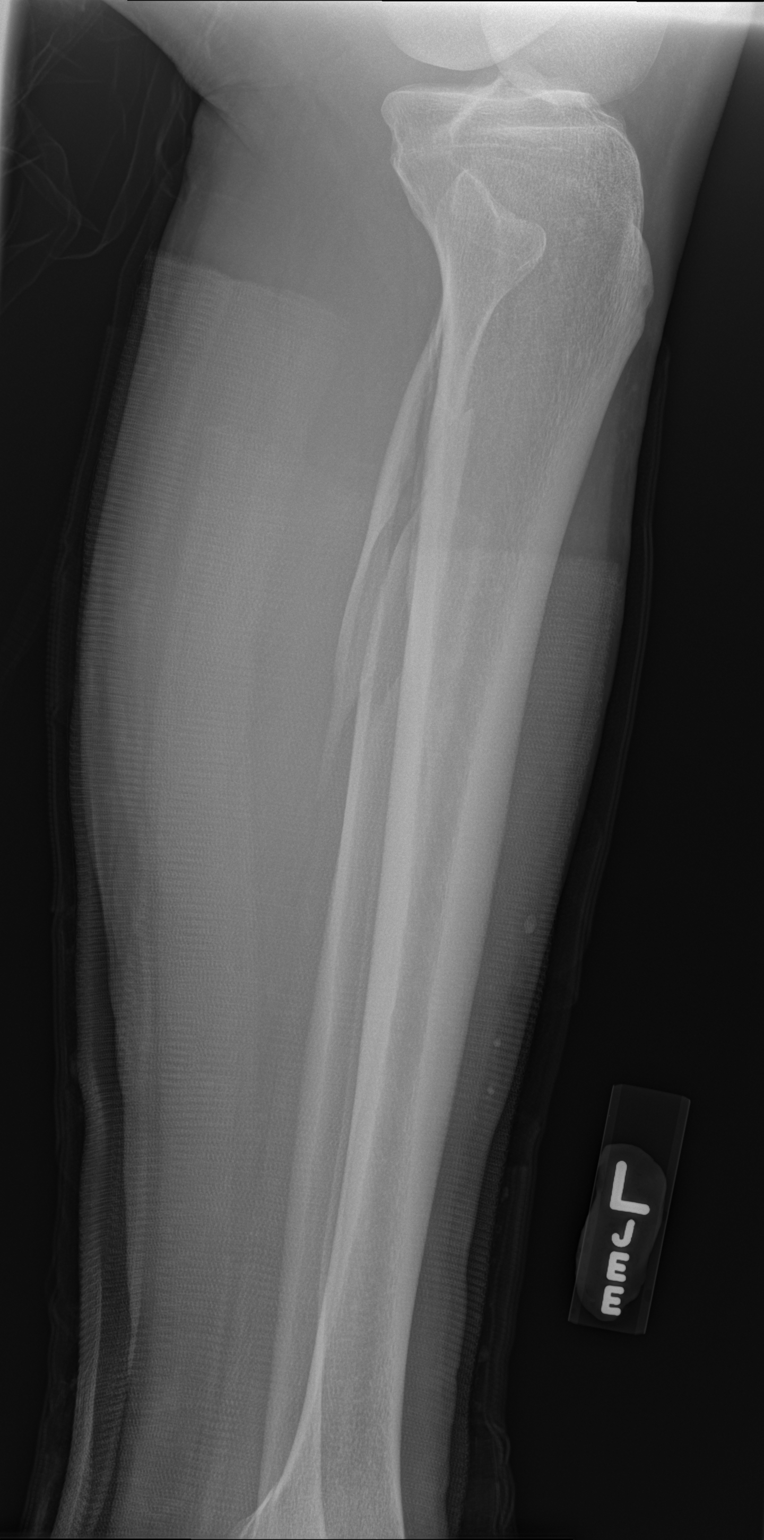

[x tib-fib ap left (1 of 2)]
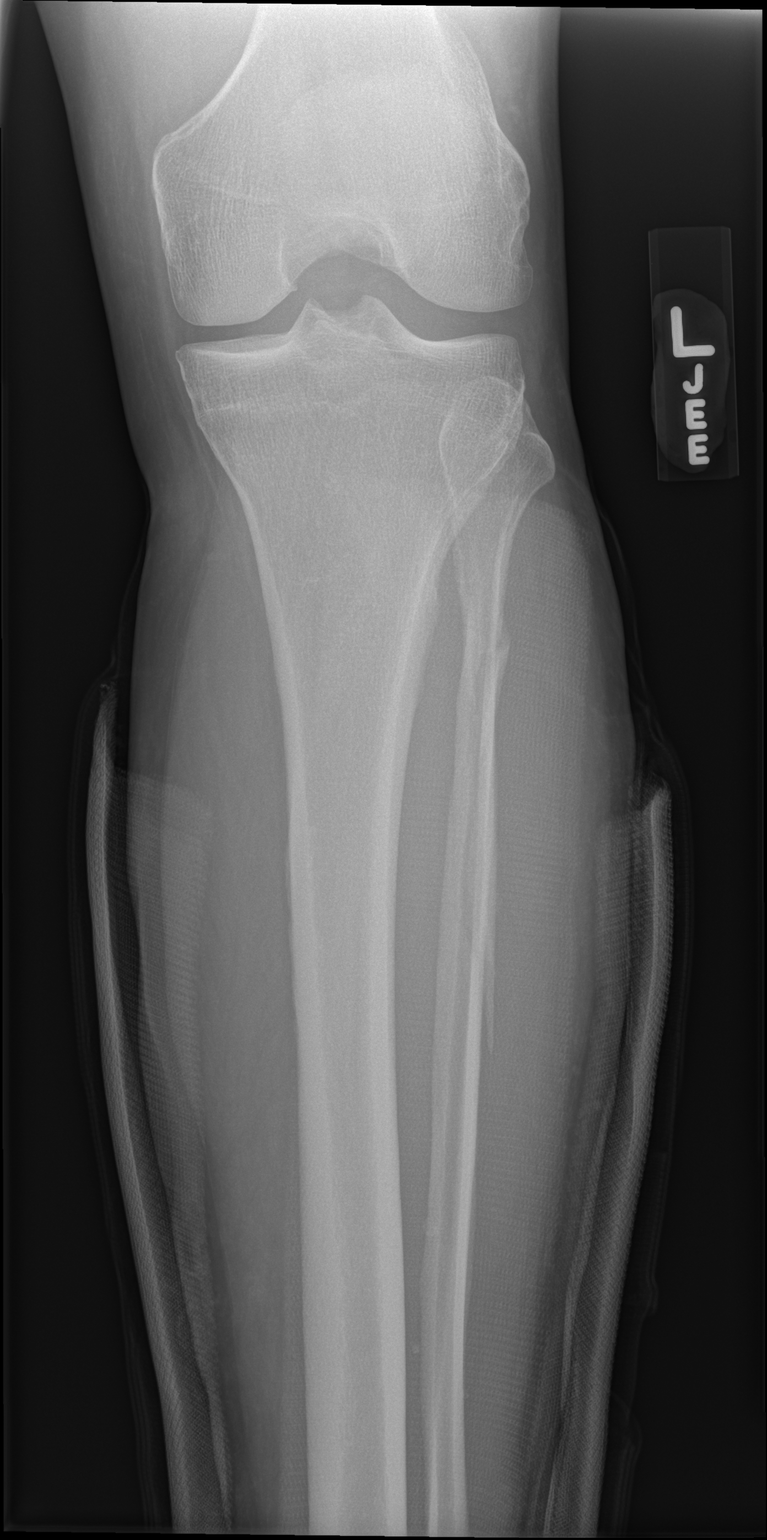

[x tib-fib ap left (2 of 2)]
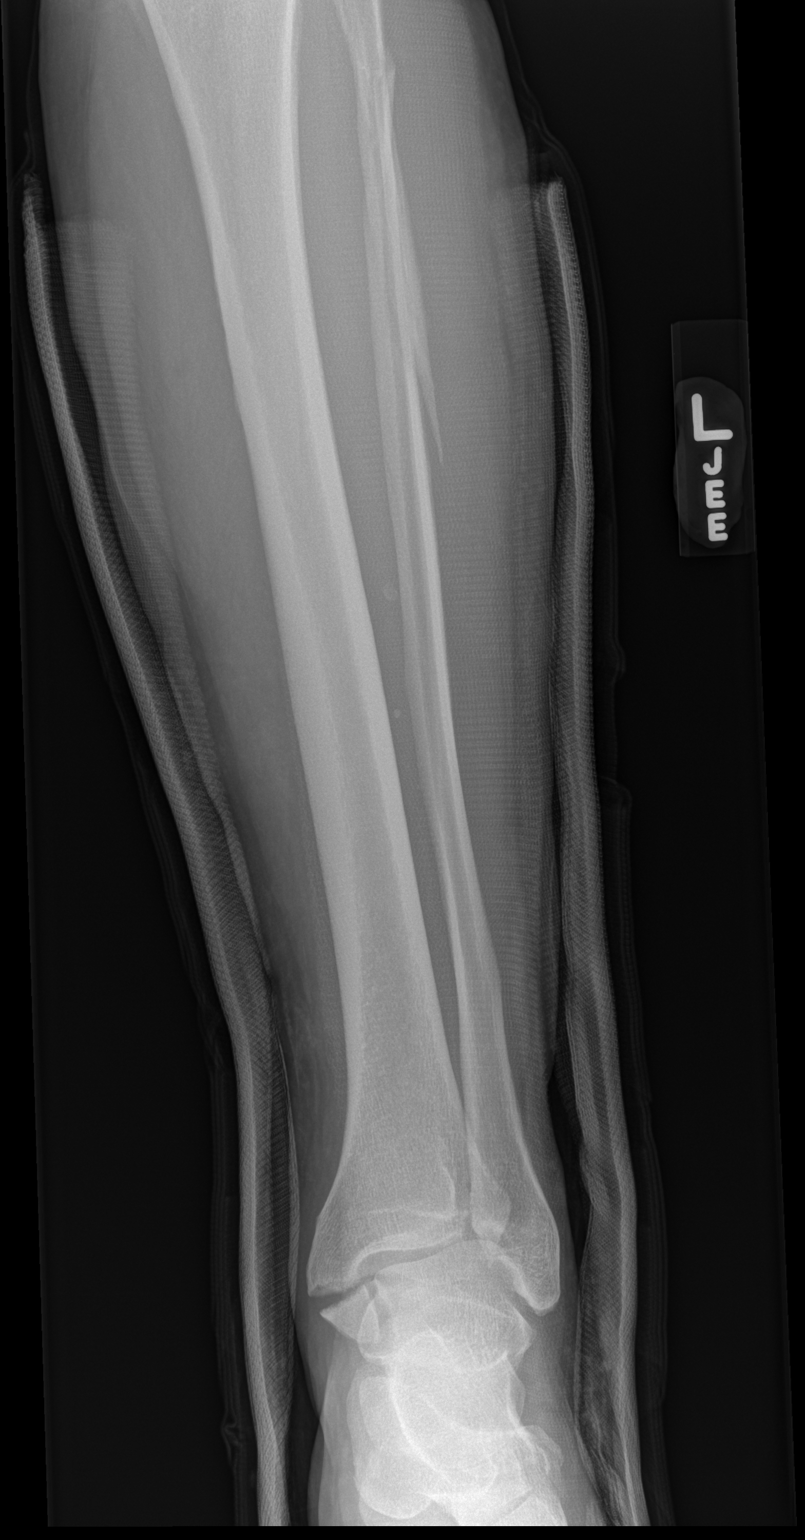

[4 of 4 positions shown; findings below may reference images not displayed]

FINDINGS: Interval reduction of the left ankle fracture
dislocation.  Mild continued displacement of the medial malleolar
fracture and slight displacement of the posterior malleolar
fracture.  Ankle mortise alignment much improved.

There is an oblique fracture through the proximal and midportions
of the left fibular shaft.  Approximate 7 mm of anterior
displacement of the distal fragment relative to the proximal
fragment.
IMPRESSION: Postreduction of the left ankle as above.

Oblique mildly displaced proximal and mid shaft fibular fracture.

## 2016-03-08 DIAGNOSIS — Z125 Encounter for screening for malignant neoplasm of prostate: Secondary | ICD-10-CM | POA: Diagnosis not present

## 2016-03-08 DIAGNOSIS — Z1211 Encounter for screening for malignant neoplasm of colon: Secondary | ICD-10-CM | POA: Diagnosis not present

## 2016-03-08 DIAGNOSIS — Z0000 Encounter for general adult medical examination without abnormal findings: Secondary | ICD-10-CM | POA: Diagnosis not present

## 2016-03-08 DIAGNOSIS — E7800 Pure hypercholesterolemia, unspecified: Secondary | ICD-10-CM | POA: Diagnosis not present

## 2016-09-10 DIAGNOSIS — K4090 Unilateral inguinal hernia, without obstruction or gangrene, not specified as recurrent: Secondary | ICD-10-CM | POA: Diagnosis not present

## 2016-09-10 DIAGNOSIS — I10 Essential (primary) hypertension: Secondary | ICD-10-CM | POA: Diagnosis not present

## 2016-09-10 DIAGNOSIS — E7800 Pure hypercholesterolemia, unspecified: Secondary | ICD-10-CM | POA: Diagnosis not present

## 2016-09-10 DIAGNOSIS — Z23 Encounter for immunization: Secondary | ICD-10-CM | POA: Diagnosis not present

## 2017-03-14 DIAGNOSIS — E7800 Pure hypercholesterolemia, unspecified: Secondary | ICD-10-CM | POA: Diagnosis not present

## 2017-03-14 DIAGNOSIS — Z125 Encounter for screening for malignant neoplasm of prostate: Secondary | ICD-10-CM | POA: Diagnosis not present

## 2017-03-14 DIAGNOSIS — Z0000 Encounter for general adult medical examination without abnormal findings: Secondary | ICD-10-CM | POA: Diagnosis not present

## 2017-04-01 DIAGNOSIS — Z1211 Encounter for screening for malignant neoplasm of colon: Secondary | ICD-10-CM | POA: Diagnosis not present

## 2017-09-17 DIAGNOSIS — E7800 Pure hypercholesterolemia, unspecified: Secondary | ICD-10-CM | POA: Diagnosis not present

## 2017-09-17 DIAGNOSIS — I10 Essential (primary) hypertension: Secondary | ICD-10-CM | POA: Diagnosis not present

## 2017-09-17 DIAGNOSIS — Z23 Encounter for immunization: Secondary | ICD-10-CM | POA: Diagnosis not present

## 2018-04-15 DIAGNOSIS — E7800 Pure hypercholesterolemia, unspecified: Secondary | ICD-10-CM | POA: Diagnosis not present

## 2018-04-15 DIAGNOSIS — Z0000 Encounter for general adult medical examination without abnormal findings: Secondary | ICD-10-CM | POA: Diagnosis not present

## 2018-04-15 DIAGNOSIS — Z125 Encounter for screening for malignant neoplasm of prostate: Secondary | ICD-10-CM | POA: Diagnosis not present

## 2018-04-15 DIAGNOSIS — Z1159 Encounter for screening for other viral diseases: Secondary | ICD-10-CM | POA: Diagnosis not present

## 2018-04-30 DIAGNOSIS — R7301 Impaired fasting glucose: Secondary | ICD-10-CM | POA: Diagnosis not present

## 2018-11-06 DIAGNOSIS — E7800 Pure hypercholesterolemia, unspecified: Secondary | ICD-10-CM | POA: Diagnosis not present

## 2018-11-06 DIAGNOSIS — K4090 Unilateral inguinal hernia, without obstruction or gangrene, not specified as recurrent: Secondary | ICD-10-CM | POA: Diagnosis not present

## 2018-11-06 DIAGNOSIS — I10 Essential (primary) hypertension: Secondary | ICD-10-CM | POA: Diagnosis not present

## 2018-11-24 DIAGNOSIS — Z01818 Encounter for other preprocedural examination: Secondary | ICD-10-CM | POA: Diagnosis not present

## 2018-12-17 DIAGNOSIS — H52223 Regular astigmatism, bilateral: Secondary | ICD-10-CM | POA: Diagnosis not present

## 2019-01-05 DIAGNOSIS — K5730 Diverticulosis of large intestine without perforation or abscess without bleeding: Secondary | ICD-10-CM | POA: Diagnosis not present

## 2019-01-05 DIAGNOSIS — Z1211 Encounter for screening for malignant neoplasm of colon: Secondary | ICD-10-CM | POA: Diagnosis not present

## 2019-04-17 DIAGNOSIS — Z0000 Encounter for general adult medical examination without abnormal findings: Secondary | ICD-10-CM | POA: Diagnosis not present

## 2019-04-24 DIAGNOSIS — Z125 Encounter for screening for malignant neoplasm of prostate: Secondary | ICD-10-CM | POA: Diagnosis not present

## 2019-04-24 DIAGNOSIS — Z8249 Family history of ischemic heart disease and other diseases of the circulatory system: Secondary | ICD-10-CM | POA: Diagnosis not present

## 2019-04-24 DIAGNOSIS — E7800 Pure hypercholesterolemia, unspecified: Secondary | ICD-10-CM | POA: Diagnosis not present

## 2019-04-24 DIAGNOSIS — I10 Essential (primary) hypertension: Secondary | ICD-10-CM | POA: Diagnosis not present

## 2019-05-08 DIAGNOSIS — T162XXA Foreign body in left ear, initial encounter: Secondary | ICD-10-CM | POA: Diagnosis not present

## 2019-05-08 DIAGNOSIS — H9202 Otalgia, left ear: Secondary | ICD-10-CM | POA: Diagnosis not present

## 2019-11-23 ENCOUNTER — Ambulatory Visit: Payer: Self-pay | Admitting: Surgery

## 2019-11-23 DIAGNOSIS — K4020 Bilateral inguinal hernia, without obstruction or gangrene, not specified as recurrent: Secondary | ICD-10-CM | POA: Diagnosis not present

## 2019-11-23 DIAGNOSIS — T8189XA Other complications of procedures, not elsewhere classified, initial encounter: Secondary | ICD-10-CM | POA: Diagnosis not present

## 2019-11-23 NOTE — H&P (Signed)
Darren Russo Documented: 11/23/2019 9:29 AM Location: [facility] Patient #: [phone] DOB: Dec 16, 1955 Married / Language: [name] / Race: White Male  History of Present Illness Darren Russo A. Myeesha Shane MD; 11/23/2019 10:37 AM) Patient words: The patient presents with chief complaint of bilateral groin bulges and discomfort at umbilicus. In 2014 he underwent primary repair of umbilical hernia without mesh. He developed some pain stabbing sensation around his umbilicus. He has a 2-3 year history of bulges in both groins are getting larger. Left is greater than right in both cause mild to moderate discomfort with lifting or straining. He denies nausea and vomiting. He does have some drainage from his umbilicus he states which is minimal. He does have shooting pain that he noticed around Christmas time around his umbilicus associated with lifting. He has no change in bowel or bladder function.  The patient is a 64 year old male.   Diagnostic Studies History (Darren Russo, [place]; 11/23/2019 9:30 AM) Colonoscopy within last year  Allergies (Darren Russo; 11/23/2019 9:30 AM) No Known Drug Allergies [11/23/2019]: No Known Allergies [11/23/2019]: Allergies Reconciled  Medication History (Darren Russo; 11/23/2019 9:30 AM) Atorvastatin Calcium (80MG  Tablet, Oral) Active. Losartan Potassium (50MG  Tablet, Oral) Active. Medications Reconciled  Social History (Darren Russo; 11/23/2019 9:30 AM) Alcohol use Moderate alcohol use. Caffeine use Coffee, Tea. No drug use Tobacco use Never smoker.  Family History (Darren Russo, Darren Russo; 11/23/2019 9:30 AM) Breast Cancer Mother, Sister. Hypertension Mother. Melanoma Brother.  Other Problems (Darren Russo; 11/23/2019 9:30 AM) High blood pressure Hypercholesterolemia Inguinal Hernia Umbilical Hernia Repair     Review of Systems (Darren Russo; 11/23/2019 9:30 AM) General Not Present- Appetite Loss, Chills,  Fatigue, Fever, Night Sweats, Weight Gain and Weight Loss. Skin Not Present- Change in Wart/Mole, Dryness, Hives, Jaundice, New Lesions, Non-Healing Wounds, Rash and Ulcer. HEENT Present- Wears glasses/contact lenses. Not Present- Earache, Hearing Loss, Hoarseness, Nose Bleed, Oral Ulcers, Ringing in the Ears, Seasonal Allergies, Sinus Pain, Sore Throat, Visual Disturbances and Yellow Eyes. Respiratory Present- Snoring. Not Present- Bloody sputum, Chronic Cough, Difficulty Breathing and Wheezing. Breast Not Present- Breast Mass, Breast Pain, Nipple Discharge and Skin Changes. Cardiovascular Not Present- Chest Pain, Difficulty Breathing Lying Down, Leg Cramps, Palpitations, Rapid Heart Rate, Shortness of Breath and Swelling of Extremities. Gastrointestinal Present- Abdominal Pain. Not Present- Bloating, Bloody Stool, Change in Bowel Habits, Chronic diarrhea, Constipation, Difficulty Swallowing, Excessive gas, Gets full quickly at meals, Hemorrhoids, Indigestion, Nausea, Rectal Pain and Vomiting. Male Genitourinary Not Present- Blood in Urine, Change in Urinary Stream, Frequency, Impotence, Nocturia, Painful Urination, Urgency and Urine Leakage. Musculoskeletal Present- Back Pain and Joint Stiffness. Not Present- Joint Pain, Muscle Pain, Muscle Weakness and Swelling of Extremities. Neurological Not Present- Decreased Memory, Fainting, Headaches, Numbness, Seizures, Tingling, Tremor, Trouble walking and Weakness. Psychiatric Not Present- Anxiety, Bipolar, Change in Sleep Pattern, Depression, Fearful and Frequent crying. Endocrine Not Present- Cold Intolerance, Excessive Hunger, Hair Changes, Heat Intolerance, Hot flashes and New Diabetes. Hematology Not Present- Blood Thinners, Easy Bruising, Excessive bleeding, Gland problems, HIV and Persistent Infections.  Vitals (Darren Russo; 11/23/2019 9:30 AM) 11/23/2019 9:30 AM Weight: 196.5 lb Height: 70in Body Surface Area: 2.07 m Body Mass Index:  28.19 kg/m  Temp.: 74F  Pulse: 87 (Regular)  BP: 130/70 (Sitting, Left Arm, Standard)        Physical Exam (Darren Holm A. Zacariah Belue MD; 11/23/2019 10:39 AM)  General Mental Status-Alert. General Appearance-Consistent with stated age. Hydration-Well hydrated. Voice-Normal.  Chest and Lung Exam Note:  WOB normal no wheezing  Cardiovascular Note: NSR  Abdomen Note: Stitch granuloma umbilicus noted. Minimal drainage and no signs of infection. No recurrent hernia. Bilateral reducible inguinal hernia. Left greater than right. Scrotal component noted on the left. Testicles normal bilaterally. Soft nontender otherwise  Neurologic Neurologic evaluation reveals -alert and oriented x 3 with no impairment of recent or remote memory. Mental Status-Normal.  Musculoskeletal Normal Exam - Left-Upper Extremity Strength Normal and Lower Extremity Strength Normal. Normal Exam - Right-Upper Extremity Strength Normal and Lower Extremity Strength Normal.    Assessment & Plan (Darren Mathieu A. Oluwaferanmi Wain MD; 11/23/2019 10:41 AM)  BILATERAL INGUINAL HERNIA WITHOUT OBSTRUCTION OR GANGRENE, RECURRENCE NOT SPECIFIED (K40.20) Impression: Discussed laparoscopic and open techniques. Discussed the use of mesh and the pros and cons of mesh use. Discussed potential complications, long-term expectation in recovery times for both. He has opted for bilateral open repairs with mesh. The risk of hernia repair include bleeding, infection, organ injury, bowel injury, bladder injury, nerve injury recurrent hernia, blood clots, worsening of underlying condition, chronic pain, mesh use, open surgery, death, and the need for other operations. Pt agrees to proceed   TOTAL TIME 20 MINUTES FOREXAM HISTORY REVIEW DOCUMENTS, TESTS CHART   STITCH GRANULOMA, INITIAL ENCOUNTER (T81.89XA) Impression: Recommend debridement in the operating room with removal of sutures and repair of the umbilicus. No evidence of  recurrent hernia on exam.  Current Plans You are being scheduled for surgery- Our schedulers will call you.  You should hear from our office's scheduling department within 5 working days about the location, date, and time of surgery. We try to make accommodations for patient's preferences in scheduling surgery, but sometimes the OR schedule or the surgeon's schedule prevents [place] from making those accommodations.  If you have not heard from our office [phone]) in 5 working days, call the office and ask for your surgeon's nurse.  If you have other questions about your diagnosis, plan, or surgery, call the office and ask for your surgeon's nurse.  Pt Education - Pamphlet Given - Hernia Surgery: discussed with patient and provided information. Pt Education - CCS Mesh education: discussed with patient and provided information. Pt Education - Consent for inguinal hernia : discussed with patient and provided information.

## 2019-11-23 NOTE — H&P (View-Only) (Signed)
Gar Gibbon Documented: 11/23/2019 9:29 AM Location: [facility] Patient #: [phone] DOB: 15-Nov-1955 Married / Language: [name] / Race: White Male  History of Present Illness [name] A. Josephus Harriger MD; 11/23/2019 10:37 AM) Patient words: The patient presents with chief complaint of bilateral groin bulges and discomfort at umbilicus. In 2014 he underwent primary repair of umbilical hernia without mesh. He developed some pain stabbing sensation around his umbilicus. He has a 2-3 year history of bulges in both groins are getting larger. Left is greater than right in both cause mild to moderate discomfort with lifting or straining. He denies nausea and vomiting. He does have some drainage from his umbilicus he states which is minimal. He does have shooting pain that he noticed around Christmas time around his umbilicus associated with lifting. He has no change in bowel or bladder function.  The patient is a 64 year old male.   Diagnostic Studies History (Chanel Teressa Senter, [place]; 11/23/2019 9:30 AM) Colonoscopy within last year  Allergies (Chanel Teressa Senter, CMA; 11/23/2019 9:30 AM) No Known Drug Allergies [11/23/2019]: No Known Allergies [11/23/2019]: Allergies Reconciled  Medication History (Chanel Teressa Senter, CMA; 11/23/2019 9:30 AM) Atorvastatin Calcium (80MG  Tablet, Oral) Active. Losartan Potassium (50MG  Tablet, Oral) Active. Medications Reconciled  Social History (Chanel Teressa Senter, CMA; 11/23/2019 9:30 AM) Alcohol use Moderate alcohol use. Caffeine use Coffee, Tea. No drug use Tobacco use Never smoker.  Family History ([place], [place]; 11/23/2019 9:30 AM) Breast Cancer Mother, Sister. Hypertension Mother. Melanoma Brother.  Other Problems (Chanel Teressa Senter, CMA; 11/23/2019 9:30 AM) High blood pressure Hypercholesterolemia Inguinal Hernia Umbilical Hernia Repair     Review of Systems (Chanel Nolan CMA; 11/23/2019 9:30 AM) General Not Present- Appetite Loss, Chills,  Fatigue, Fever, Night Sweats, Weight Gain and Weight Loss. Skin Not Present- Change in Wart/Mole, Dryness, Hives, Jaundice, New Lesions, Non-Healing Wounds, Rash and Ulcer. HEENT Present- Wears glasses/contact lenses. Not Present- Earache, Hearing Loss, Hoarseness, Nose Bleed, Oral Ulcers, Ringing in the Ears, Seasonal Allergies, Sinus Pain, Sore Throat, Visual Disturbances and Yellow Eyes. Respiratory Present- Snoring. Not Present- Bloody sputum, Chronic Cough, Difficulty Breathing and Wheezing. Breast Not Present- Breast Mass, Breast Pain, Nipple Discharge and Skin Changes. Cardiovascular Not Present- Chest Pain, Difficulty Breathing Lying Down, Leg Cramps, Palpitations, Rapid Heart Rate, Shortness of Breath and Swelling of Extremities. Gastrointestinal Present- Abdominal Pain. Not Present- Bloating, Bloody Stool, Change in Bowel Habits, Chronic diarrhea, Constipation, Difficulty Swallowing, Excessive gas, Gets full quickly at meals, Hemorrhoids, Indigestion, Nausea, Rectal Pain and Vomiting. Male Genitourinary Not Present- Blood in Urine, Change in Urinary Stream, Frequency, Impotence, Nocturia, Painful Urination, Urgency and Urine Leakage. Musculoskeletal Present- Back Pain and Joint Stiffness. Not Present- Joint Pain, Muscle Pain, Muscle Weakness and Swelling of Extremities. Neurological Not Present- Decreased Memory, Fainting, Headaches, Numbness, Seizures, Tingling, Tremor, Trouble walking and Weakness. Psychiatric Not Present- Anxiety, Bipolar, Change in Sleep Pattern, Depression, Fearful and Frequent crying. Endocrine Not Present- Cold Intolerance, Excessive Hunger, Hair Changes, Heat Intolerance, Hot flashes and New Diabetes. Hematology Not Present- Blood Thinners, Easy Bruising, Excessive bleeding, Gland problems, HIV and Persistent Infections.  Vitals (Chanel Nolan CMA; 11/23/2019 9:30 AM) 11/23/2019 9:30 AM Weight: 196.5 lb Height: 70in Body Surface Area: 2.07 m Body Mass Index:  28.19 kg/m  Temp.: 62F  Pulse: 87 (Regular)  BP: 130/70 (Sitting, Left Arm, Standard)        Physical Exam (Zvi Duplantis A. Mahli Glahn MD; 11/23/2019 10:39 AM)  General Mental Status-Alert. General Appearance-Consistent with stated age. Hydration-Well hydrated. Voice-Normal.  Chest and Lung Exam Note:  WOB normal no wheezing  Cardiovascular Note: NSR  Abdomen Note: Stitch granuloma umbilicus noted. Minimal drainage and no signs of infection. No recurrent hernia. Bilateral reducible inguinal hernia. Left greater than right. Scrotal component noted on the left. Testicles normal bilaterally. Soft nontender otherwise  Neurologic Neurologic evaluation reveals -alert and oriented x 3 with no impairment of recent or remote memory. Mental Status-Normal.  Musculoskeletal Normal Exam - Left-Upper Extremity Strength Normal and Lower Extremity Strength Normal. Normal Exam - Right-Upper Extremity Strength Normal and Lower Extremity Strength Normal.    Assessment & Plan (Tremeka Helbling A. Tayen Narang MD; 11/23/2019 10:41 AM)  BILATERAL INGUINAL HERNIA WITHOUT OBSTRUCTION OR GANGRENE, RECURRENCE NOT SPECIFIED (K40.20) Impression: Discussed laparoscopic and open techniques. Discussed the use of mesh and the pros and cons of mesh use. Discussed potential complications, long-term expectation in recovery times for both. He has opted for bilateral open repairs with mesh. The risk of hernia repair include bleeding, infection, organ injury, bowel injury, bladder injury, nerve injury recurrent hernia, blood clots, worsening of underlying condition, chronic pain, mesh use, open surgery, death, and the need for other operations. Pt agrees to proceed   TOTAL TIME 20 MINUTES FOREXAM HISTORY REVIEW DOCUMENTS, TESTS CHART   STITCH GRANULOMA, INITIAL ENCOUNTER (T81.89XA) Impression: Recommend debridement in the operating room with removal of sutures and repair of the umbilicus. No evidence of  recurrent hernia on exam.  Current Plans You are being scheduled for surgery- Our schedulers will call you.  You should hear from our office's scheduling department within 5 working days about the location, date, and time of surgery. We try to make accommodations for patient's preferences in scheduling surgery, but sometimes the OR schedule or the surgeon's schedule prevents [place] from making those accommodations.  If you have not heard from our office [phone]) in 5 working days, call the office and ask for your surgeon's nurse.  If you have other questions about your diagnosis, plan, or surgery, call the office and ask for your surgeon's nurse.  Pt Education - Pamphlet Given - Hernia Surgery: discussed with patient and provided information. Pt Education - CCS Mesh education: discussed with patient and provided information. Pt Education - Consent for inguinal hernia : discussed with patient and provided information.

## 2019-11-27 DIAGNOSIS — E7800 Pure hypercholesterolemia, unspecified: Secondary | ICD-10-CM | POA: Diagnosis not present

## 2019-11-27 DIAGNOSIS — I10 Essential (primary) hypertension: Secondary | ICD-10-CM | POA: Diagnosis not present

## 2019-11-27 DIAGNOSIS — K4090 Unilateral inguinal hernia, without obstruction or gangrene, not specified as recurrent: Secondary | ICD-10-CM | POA: Diagnosis not present

## 2019-12-04 DIAGNOSIS — I10 Essential (primary) hypertension: Secondary | ICD-10-CM | POA: Diagnosis not present

## 2019-12-05 ENCOUNTER — Other Ambulatory Visit (HOSPITAL_COMMUNITY): Payer: Self-pay

## 2019-12-17 ENCOUNTER — Other Ambulatory Visit: Payer: Self-pay

## 2019-12-17 ENCOUNTER — Encounter (HOSPITAL_BASED_OUTPATIENT_CLINIC_OR_DEPARTMENT_OTHER): Payer: Self-pay | Admitting: Surgery

## 2019-12-19 ENCOUNTER — Other Ambulatory Visit (HOSPITAL_COMMUNITY)
Admission: RE | Admit: 2019-12-19 | Discharge: 2019-12-19 | Disposition: A | Discharge status: Home / Self Care | Payer: BC Managed Care – PPO | Source: Ambulatory Visit | Attending: Surgery | Admitting: Surgery

## 2019-12-19 DIAGNOSIS — Z20822 Contact with and (suspected) exposure to covid-19: Secondary | ICD-10-CM | POA: Diagnosis not present

## 2019-12-19 DIAGNOSIS — Z01812 Encounter for preprocedural laboratory examination: Secondary | ICD-10-CM | POA: Insufficient documentation

## 2019-12-19 LAB — SARS CORONAVIRUS 2 (TAT 6-24 HRS): SARS Coronavirus 2: NEGATIVE

## 2019-12-21 ENCOUNTER — Encounter (HOSPITAL_BASED_OUTPATIENT_CLINIC_OR_DEPARTMENT_OTHER)
Admission: RE | Admit: 2019-12-21 | Discharge: 2019-12-21 | Disposition: A | Discharge status: Home / Self Care | Payer: BC Managed Care – PPO | Source: Ambulatory Visit | Attending: Surgery | Admitting: Surgery

## 2019-12-21 ENCOUNTER — Other Ambulatory Visit: Payer: Self-pay

## 2019-12-21 DIAGNOSIS — Z01810 Encounter for preprocedural cardiovascular examination: Secondary | ICD-10-CM | POA: Diagnosis not present

## 2019-12-21 DIAGNOSIS — Z01812 Encounter for preprocedural laboratory examination: Secondary | ICD-10-CM | POA: Diagnosis not present

## 2019-12-21 LAB — CBC WITH DIFFERENTIAL/PLATELET
Abs Immature Granulocytes: 0.02 10*3/uL (ref 0.00–0.07)
Basophils Absolute: 0 10*3/uL (ref 0.0–0.1)
Basophils Relative: 1 %
Eosinophils Absolute: 0.1 10*3/uL (ref 0.0–0.5)
Eosinophils Relative: 2 %
HCT: 47.5 % (ref 39.0–52.0)
Hemoglobin: 16 g/dL (ref 13.0–17.0)
Immature Granulocytes: 0 %
Lymphocytes Relative: 26 %
Lymphs Abs: 2 10*3/uL (ref 0.7–4.0)
MCH: 31.1 pg (ref 26.0–34.0)
MCHC: 33.7 g/dL (ref 30.0–36.0)
MCV: 92.4 fL (ref 80.0–100.0)
Monocytes Absolute: 0.6 10*3/uL (ref 0.1–1.0)
Monocytes Relative: 8 %
Neutro Abs: 4.8 10*3/uL (ref 1.7–7.7)
Neutrophils Relative %: 63 %
Platelets: 278 10*3/uL (ref 150–400)
RBC: 5.14 MIL/uL (ref 4.22–5.81)
RDW: 11.9 % (ref 11.5–15.5)
WBC: 7.6 10*3/uL (ref 4.0–10.5)
nRBC: 0 % (ref 0.0–0.2)

## 2019-12-21 LAB — COMPREHENSIVE METABOLIC PANEL WITH GFR
ALT: 28 U/L (ref 0–44)
AST: 19 U/L (ref 15–41)
Albumin: 3.8 g/dL (ref 3.5–5.0)
Alkaline Phosphatase: 56 U/L (ref 38–126)
Anion gap: 12 (ref 5–15)
BUN: 10 mg/dL (ref 8–23)
CO2: 24 mmol/L (ref 22–32)
Calcium: 9.1 mg/dL (ref 8.9–10.3)
Chloride: 105 mmol/L (ref 98–111)
Creatinine, Ser: 1.13 mg/dL (ref 0.61–1.24)
GFR calc Af Amer: >60 mL/min
GFR calc non Af Amer: >60 mL/min
Glucose, Bld: 108 mg/dL — ABNORMAL HIGH (ref 70–99)
Potassium: 4.8 mmol/L (ref 3.5–5.1)
Sodium: 141 mmol/L (ref 135–145)
Total Bilirubin: 0.8 mg/dL (ref 0.3–1.2)
Total Protein: 6.8 g/dL (ref 6.5–8.1)

## 2019-12-23 ENCOUNTER — Ambulatory Visit (HOSPITAL_BASED_OUTPATIENT_CLINIC_OR_DEPARTMENT_OTHER)
Admission: RE | Admit: 2019-12-23 | Discharge: 2019-12-23 | Disposition: A | Discharge status: Home / Self Care | Payer: BC Managed Care – PPO | Attending: Surgery | Admitting: Surgery

## 2019-12-23 ENCOUNTER — Ambulatory Visit (HOSPITAL_BASED_OUTPATIENT_CLINIC_OR_DEPARTMENT_OTHER): Payer: BC Managed Care – PPO | Admitting: Anesthesiology

## 2019-12-23 ENCOUNTER — Encounter (HOSPITAL_BASED_OUTPATIENT_CLINIC_OR_DEPARTMENT_OTHER)
Admission: RE | Disposition: A | Discharge status: Home / Self Care | Payer: Self-pay | Source: Home / Self Care | Attending: Surgery

## 2019-12-23 ENCOUNTER — Encounter (HOSPITAL_BASED_OUTPATIENT_CLINIC_OR_DEPARTMENT_OTHER): Payer: Self-pay | Admitting: Surgery

## 2019-12-23 ENCOUNTER — Other Ambulatory Visit: Payer: Self-pay

## 2019-12-23 DIAGNOSIS — Z8249 Family history of ischemic heart disease and other diseases of the circulatory system: Secondary | ICD-10-CM | POA: Diagnosis not present

## 2019-12-23 DIAGNOSIS — E7800 Pure hypercholesterolemia, unspecified: Secondary | ICD-10-CM | POA: Insufficient documentation

## 2019-12-23 DIAGNOSIS — X58XXXA Exposure to other specified factors, initial encounter: Secondary | ICD-10-CM | POA: Insufficient documentation

## 2019-12-23 DIAGNOSIS — Z79899 Other long term (current) drug therapy: Secondary | ICD-10-CM | POA: Insufficient documentation

## 2019-12-23 DIAGNOSIS — Z808 Family history of malignant neoplasm of other organs or systems: Secondary | ICD-10-CM | POA: Insufficient documentation

## 2019-12-23 DIAGNOSIS — T8189XA Other complications of procedures, not elsewhere classified, initial encounter: Secondary | ICD-10-CM | POA: Insufficient documentation

## 2019-12-23 DIAGNOSIS — E785 Hyperlipidemia, unspecified: Secondary | ICD-10-CM | POA: Diagnosis not present

## 2019-12-23 DIAGNOSIS — K4020 Bilateral inguinal hernia, without obstruction or gangrene, not specified as recurrent: Secondary | ICD-10-CM | POA: Insufficient documentation

## 2019-12-23 DIAGNOSIS — D176 Benign lipomatous neoplasm of spermatic cord: Secondary | ICD-10-CM | POA: Insufficient documentation

## 2019-12-23 DIAGNOSIS — Z803 Family history of malignant neoplasm of breast: Secondary | ICD-10-CM | POA: Diagnosis not present

## 2019-12-23 DIAGNOSIS — G8918 Other acute postprocedural pain: Secondary | ICD-10-CM | POA: Diagnosis not present

## 2019-12-23 DIAGNOSIS — I10 Essential (primary) hypertension: Secondary | ICD-10-CM | POA: Insufficient documentation

## 2019-12-23 HISTORY — PX: INGUINAL HERNIA REPAIR: SHX194

## 2019-12-23 HISTORY — DX: Essential (primary) hypertension: I10

## 2019-12-23 SURGERY — REPAIR, HERNIA, INGUINAL, BILATERAL, ADULT
Anesthesia: Regional | Site: Abdomen | Laterality: Bilateral

## 2019-12-23 MED ORDER — ROCURONIUM BROMIDE 10 MG/ML (PF) SYRINGE
PREFILLED_SYRINGE | INTRAVENOUS | Status: AC
  Filled 2019-12-23: qty 10

## 2019-12-23 MED ORDER — GABAPENTIN 300 MG PO CAPS
ORAL_CAPSULE | ORAL | Status: AC
  Filled 2019-12-23: qty 1

## 2019-12-23 MED ORDER — BUPIVACAINE HCL (PF) 0.25 % IJ SOLN
INTRAMUSCULAR | Status: AC
  Filled 2019-12-23: qty 30

## 2019-12-23 MED ORDER — GABAPENTIN 300 MG PO CAPS
300.0000 mg | ORAL_CAPSULE | ORAL | Status: AC
  Administered 2019-12-23: 09:00:00 300 mg via ORAL

## 2019-12-23 MED ORDER — PROPOFOL 10 MG/ML IV BOLUS
INTRAVENOUS | Status: AC
  Filled 2019-12-23: qty 20

## 2019-12-23 MED ORDER — LIDOCAINE HCL (CARDIAC) PF 100 MG/5ML IV SOSY
PREFILLED_SYRINGE | INTRAVENOUS | Status: DC | PRN
  Administered 2019-12-23: 60 mg via INTRAVENOUS

## 2019-12-23 MED ORDER — CHLORHEXIDINE GLUCONATE CLOTH 2 % EX PADS: 6.0000 | MEDICATED_PAD | Freq: Once | CUTANEOUS | Status: DC

## 2019-12-23 MED ORDER — ROCURONIUM BROMIDE 10 MG/ML (PF) SYRINGE
PREFILLED_SYRINGE | INTRAVENOUS | Status: AC
  Filled 2019-12-23: qty 20

## 2019-12-23 MED ORDER — HYDROMORPHONE HCL 1 MG/ML IJ SOLN
INTRAMUSCULAR | Status: AC
  Filled 2019-12-23: qty 0.5

## 2019-12-23 MED ORDER — OXYCODONE HCL 5 MG PO TABS: 5.0000 mg | ORAL_TABLET | Freq: Four times a day (QID) | ORAL | 0 refills | Status: AC | PRN

## 2019-12-23 MED ORDER — PROPOFOL 10 MG/ML IV BOLUS
INTRAVENOUS | Status: DC | PRN
  Administered 2019-12-23: 150 mg via INTRAVENOUS

## 2019-12-23 MED ORDER — FENTANYL CITRATE (PF) 100 MCG/2ML IJ SOLN
INTRAMUSCULAR | Status: DC | PRN
  Administered 2019-12-23: 50 ug via INTRAVENOUS

## 2019-12-23 MED ORDER — CLONIDINE HCL (ANALGESIA) 100 MCG/ML EP SOLN
EPIDURAL | Status: DC | PRN
  Administered 2019-12-23: 200 ug

## 2019-12-23 MED ORDER — FENTANYL CITRATE (PF) 100 MCG/2ML IJ SOLN
INTRAMUSCULAR | Status: AC
  Filled 2019-12-23: qty 2

## 2019-12-23 MED ORDER — ROCURONIUM BROMIDE 100 MG/10ML IV SOLN
INTRAVENOUS | Status: DC | PRN
  Administered 2019-12-23: 50 mg via INTRAVENOUS
  Administered 2019-12-23: 30 mg via INTRAVENOUS

## 2019-12-23 MED ORDER — DEXAMETHASONE SODIUM PHOSPHATE 10 MG/ML IJ SOLN
INTRAMUSCULAR | Status: DC | PRN
  Administered 2019-12-23: 5 mg via INTRAVENOUS

## 2019-12-23 MED ORDER — CEFAZOLIN SODIUM-DEXTROSE 2-3 GM-%(50ML) IV SOLR
INTRAVENOUS | Status: DC | PRN
  Administered 2019-12-23: 2 g via INTRAVENOUS

## 2019-12-23 MED ORDER — HYDROMORPHONE HCL 1 MG/ML IJ SOLN
0.2500 mg | INTRAMUSCULAR | Status: DC | PRN
  Administered 2019-12-23 (×2): 0.25 mg via INTRAVENOUS
  Administered 2019-12-23: 0.5 mg via INTRAVENOUS

## 2019-12-23 MED ORDER — PHENYLEPHRINE 40 MCG/ML (10ML) SYRINGE FOR IV PUSH (FOR BLOOD PRESSURE SUPPORT)
PREFILLED_SYRINGE | INTRAVENOUS | Status: DC | PRN
  Administered 2019-12-23: 80 ug via INTRAVENOUS

## 2019-12-23 MED ORDER — LIDOCAINE 2% (20 MG/ML) 5 ML SYRINGE
INTRAMUSCULAR | Status: AC
  Filled 2019-12-23: qty 5

## 2019-12-23 MED ORDER — MIDAZOLAM HCL 2 MG/2ML IJ SOLN
1.0000 mg | INTRAMUSCULAR | Status: DC | PRN
  Administered 2019-12-23: 10:00:00 2 mg via INTRAVENOUS

## 2019-12-23 MED ORDER — LACTATED RINGERS IV SOLN: INTRAVENOUS | Status: DC

## 2019-12-23 MED ORDER — ROPIVACAINE HCL 5 MG/ML IJ SOLN
INTRAMUSCULAR | Status: DC | PRN
  Administered 2019-12-23: 40 mL via PERINEURAL

## 2019-12-23 MED ORDER — OXYCODONE HCL 5 MG/5ML PO SOLN: 5.0000 mg | Freq: Once | ORAL | Status: AC | PRN

## 2019-12-23 MED ORDER — MIDAZOLAM HCL 2 MG/2ML IJ SOLN
INTRAMUSCULAR | Status: AC
  Filled 2019-12-23: qty 2

## 2019-12-23 MED ORDER — SUGAMMADEX SODIUM 200 MG/2ML IV SOLN
INTRAVENOUS | Status: DC | PRN
  Administered 2019-12-23: 200 mg via INTRAVENOUS

## 2019-12-23 MED ORDER — DEXTROSE 5 % IV SOLN: 3.0000 g | INTRAVENOUS | Status: DC

## 2019-12-23 MED ORDER — CEFAZOLIN SODIUM-DEXTROSE 2-4 GM/100ML-% IV SOLN
INTRAVENOUS | Status: AC
  Filled 2019-12-23: qty 100

## 2019-12-23 MED ORDER — OXYCODONE HCL 5 MG PO TABS
5.0000 mg | ORAL_TABLET | Freq: Once | ORAL | Status: AC | PRN
  Administered 2019-12-23: 15:00:00 5 mg via ORAL

## 2019-12-23 MED ORDER — BUPIVACAINE-EPINEPHRINE 0.25% -1:200000 IJ SOLN
INTRAMUSCULAR | Status: DC | PRN
  Administered 2019-12-23: 8.5 mL

## 2019-12-23 MED ORDER — CELECOXIB 200 MG PO CAPS
200.0000 mg | ORAL_CAPSULE | ORAL | Status: AC
  Administered 2019-12-23: 09:00:00 200 mg via ORAL

## 2019-12-23 MED ORDER — ONDANSETRON HCL 4 MG/2ML IJ SOLN
INTRAMUSCULAR | Status: DC | PRN
  Administered 2019-12-23: 4 mg via INTRAVENOUS

## 2019-12-23 MED ORDER — FENTANYL CITRATE (PF) 100 MCG/2ML IJ SOLN
50.0000 ug | INTRAMUSCULAR | Status: DC | PRN
  Administered 2019-12-23: 10:00:00 100 ug via INTRAVENOUS

## 2019-12-23 MED ORDER — ACETAMINOPHEN 500 MG PO TABS
1000.0000 mg | ORAL_TABLET | ORAL | Status: AC
  Administered 2019-12-23: 09:00:00 1000 mg via ORAL

## 2019-12-23 MED ORDER — IBUPROFEN 800 MG PO TABS: 800.0000 mg | ORAL_TABLET | Freq: Three times a day (TID) | ORAL | 0 refills | Status: AC | PRN

## 2019-12-23 MED ORDER — OXYCODONE HCL 5 MG PO TABS
ORAL_TABLET | ORAL | Status: AC
  Filled 2019-12-23: qty 1

## 2019-12-23 MED ORDER — 0.9 % SODIUM CHLORIDE (POUR BTL) OPTIME
TOPICAL | Status: DC | PRN
  Administered 2019-12-23: 50 mL

## 2019-12-23 MED ORDER — CELECOXIB 200 MG PO CAPS
ORAL_CAPSULE | ORAL | Status: AC
  Filled 2019-12-23: qty 1

## 2019-12-23 MED ORDER — PROMETHAZINE HCL 25 MG/ML IJ SOLN: 6.2500 mg | INTRAMUSCULAR | Status: DC | PRN

## 2019-12-23 MED ORDER — ACETAMINOPHEN 500 MG PO TABS
ORAL_TABLET | ORAL | Status: AC
  Filled 2019-12-23: qty 2

## 2019-12-23 SURGICAL SUPPLY — 61 items
ADH SKN CLS APL DERMABOND .7 (GAUZE/BANDAGES/DRESSINGS) ×1
APL PRP STRL LF DISP 70% ISPRP (MISCELLANEOUS) ×1
BLADE CLIPPER SURG (BLADE) ×2 IMPLANT
BLADE SURG 10 STRL SS (BLADE) ×3 IMPLANT
BLADE SURG 15 STRL LF DISP TIS (BLADE) ×1 IMPLANT
BLADE SURG 15 STRL SS (BLADE) ×3
CANISTER SUCT 1200ML W/VALVE (MISCELLANEOUS) IMPLANT
CHLORAPREP W/TINT 26 (MISCELLANEOUS) ×3 IMPLANT
COVER BACK TABLE 60X90IN (DRAPES) ×3 IMPLANT
COVER MAYO STAND STRL (DRAPES) ×3 IMPLANT
COVER WAND RF STERILE (DRAPES) IMPLANT
DECANTER SPIKE VIAL GLASS SM (MISCELLANEOUS) ×3 IMPLANT
DERMABOND ADVANCED (GAUZE/BANDAGES/DRESSINGS) ×2
DERMABOND ADVANCED .7 DNX12 (GAUZE/BANDAGES/DRESSINGS) ×1 IMPLANT
DRAIN PENROSE 1/2X12 LTX STRL (WOUND CARE) ×3 IMPLANT
DRAPE LAPAROTOMY TRNSV 102X78 (DRAPES) ×3 IMPLANT
DRAPE UTILITY XL STRL (DRAPES) ×3 IMPLANT
ELECT COATED BLADE 2.86 ST (ELECTRODE) ×3 IMPLANT
ELECT REM PT RETURN 9FT ADLT (ELECTROSURGICAL) ×3
ELECTRODE REM PT RTRN 9FT ADLT (ELECTROSURGICAL) ×1 IMPLANT
GAUZE 4X4 16PLY RFD (DISPOSABLE) IMPLANT
GAUZE SPONGE 4X4 12PLY STRL LF (GAUZE/BANDAGES/DRESSINGS) IMPLANT
GLOVE BIO SURGEON STRL SZ7 (GLOVE) ×2 IMPLANT
GLOVE BIOGEL PI IND STRL 7.0 (GLOVE) IMPLANT
GLOVE BIOGEL PI IND STRL 7.5 (GLOVE) IMPLANT
GLOVE BIOGEL PI IND STRL 8 (GLOVE) ×1 IMPLANT
GLOVE BIOGEL PI INDICATOR 7.0 (GLOVE) ×4
GLOVE BIOGEL PI INDICATOR 7.5 (GLOVE) ×2
GLOVE BIOGEL PI INDICATOR 8 (GLOVE) ×2
GLOVE ECLIPSE 6.5 STRL STRAW (GLOVE) ×2 IMPLANT
GLOVE ECLIPSE 8.0 STRL XLNG CF (GLOVE) ×3 IMPLANT
GOWN STRL REUS W/ TWL LRG LVL3 (GOWN DISPOSABLE) ×2 IMPLANT
GOWN STRL REUS W/ TWL XL LVL3 (GOWN DISPOSABLE) IMPLANT
GOWN STRL REUS W/TWL LRG LVL3 (GOWN DISPOSABLE) ×6
GOWN STRL REUS W/TWL XL LVL3 (GOWN DISPOSABLE) ×3
MESH HERNIA SYS ULTRAPRO LRG (Mesh General) ×4 IMPLANT
NDL HYPO 25X1 1.5 SAFETY (NEEDLE) ×1 IMPLANT
NEEDLE HYPO 25X1 1.5 SAFETY (NEEDLE) ×3 IMPLANT
NS IRRIG 1000ML POUR BTL (IV SOLUTION) ×2 IMPLANT
PACK BASIN DAY SURGERY FS (CUSTOM PROCEDURE TRAY) ×3 IMPLANT
PENCIL SMOKE EVACUATOR (MISCELLANEOUS) ×3 IMPLANT
SLEEVE SCD COMPRESS KNEE MED (MISCELLANEOUS) ×3 IMPLANT
SPONGE LAP 4X18 RFD (DISPOSABLE) ×3 IMPLANT
STAPLER VISISTAT 35W (STAPLE) IMPLANT
SUT MON AB 4-0 PC3 18 (SUTURE) ×6 IMPLANT
SUT NOVA 0 T19/GS 22DT (SUTURE) ×4 IMPLANT
SUT NOVA 1 T20/GS 25DT (SUTURE) ×6 IMPLANT
SUT VIC AB 0 SH 27 (SUTURE) ×4 IMPLANT
SUT VIC AB 2-0 SH 27 (SUTURE) ×6
SUT VIC AB 2-0 SH 27XBRD (SUTURE) ×2 IMPLANT
SUT VIC AB 3-0 54X BRD REEL (SUTURE) IMPLANT
SUT VIC AB 3-0 BRD 54 (SUTURE)
SUT VICRYL 3-0 CR8 SH (SUTURE) ×4 IMPLANT
SUT VICRYL AB 2 0 TIE (SUTURE) IMPLANT
SUT VICRYL AB 2 0 TIES (SUTURE)
SYR CONTROL 10ML LL (SYRINGE) ×5 IMPLANT
TAPE HYPAFIX 4 X10 (GAUZE/BANDAGES/DRESSINGS) IMPLANT
TOWEL GREEN STERILE FF (TOWEL DISPOSABLE) ×4 IMPLANT
TUBE CONNECTING 20'X1/4 (TUBING)
TUBE CONNECTING 20X1/4 (TUBING) IMPLANT
YANKAUER SUCT BULB TIP NO VENT (SUCTIONS) IMPLANT

## 2019-12-23 NOTE — Anesthesia Procedure Notes (Signed)
Anesthesia Regional Block: TAP block   Pre-Anesthetic Checklist: ,, timeout performed, Correct Patient, Correct Site, Correct Laterality, Correct Procedure, Correct Position, site marked, Risks and benefits discussed,  Surgical consent,  Pre-op evaluation,  At surgeon's request and post-op pain management  Laterality: Left  Prep: chloraprep       Needles:  Injection technique: Single-shot  Needle Type: Echogenic Stimulator Needle     Needle Length: 10cm  Needle Gauge: 21     Additional Needles:   Procedures:,,,, ultrasound used (permanent image in chart),,,,  Narrative:  Start time: 12/23/2019 9:50 AM End time: 12/23/2019 9:55 AM Injection made incrementally with aspirations every 5 mL.  Performed by: Personally  Anesthesiologist: Murvin Natal, MD  Additional Notes: Functioning IV was confirmed and monitors were applied.  A timeout was performed. Sterile prep, hand hygiene and sterile gloves were used. A 19mm 21ga [place] echogenic stimulator needle was used. Negative aspiration and negative test dose prior to incremental administration of local anesthetic. The patient tolerated the procedure well.  Ultrasound guidance: relevent anatomy identified, needle position confirmed, local anesthetic spread visualized around nerve(s), vascular puncture avoided.  Image printed for medical record.

## 2019-12-23 NOTE — Anesthesia Procedure Notes (Signed)
Anesthesia Regional Block: TAP block   Pre-Anesthetic Checklist: ,, timeout performed, Correct Patient, Correct Site, Correct Laterality, Correct Procedure, Correct Position, site marked, Risks and benefits discussed,  Surgical consent,  Pre-op evaluation,  At surgeon's request and post-op pain management  Laterality: Right  Prep: chloraprep       Needles:  Injection technique: Single-shot  Needle Type: Echogenic Stimulator Needle     Needle Length: 10cm  Needle Gauge: 21     Additional Needles:   Procedures:,,,, ultrasound used (permanent image in chart),,,,  Narrative:  Start time: 12/23/2019 9:40 AM End time: 12/23/2019 9:50 AM Injection made incrementally with aspirations every 5 mL.  Performed by: Personally  Anesthesiologist: Murvin Natal, MD  Additional Notes: Functioning IV was confirmed and monitors were applied.  A timeout was performed. Sterile prep, hand hygiene and sterile gloves were used. A 124mm 21ga [place] echogenic stimulator needle was used. Negative aspiration and negative test dose prior to incremental administration of local anesthetic. The patient tolerated the procedure well.  Ultrasound guidance: relevent anatomy identified, needle position confirmed, local anesthetic spread visualized around nerve(s), vascular puncture avoided.  Image printed for medical record.

## 2019-12-23 NOTE — Anesthesia Preprocedure Evaluation (Addendum)
Anesthesia Evaluation  Patient identified by MRN, date of birth, ID band Patient awake    Reviewed: Allergy & Precautions, NPO status , Patient's Chart, lab work & pertinent test results  Airway Mallampati: II  TM Distance: >3 FB Neck ROM: Full    Dental no notable dental hx.    Pulmonary neg pulmonary ROS, Patient abstained from smoking.,    Pulmonary exam normal breath sounds clear to auscultation       Cardiovascular hypertension, Pt. on medications Normal cardiovascular exam Rhythm:Regular Rate:Normal  ECG: NSR, rate 66   Neuro/Psych negative neurological ROS  negative psych ROS   GI/Hepatic negative GI ROS, Neg liver ROS,   Endo/Other  negative endocrine ROS  Renal/GU negative Renal ROS     Musculoskeletal negative musculoskeletal ROS (+)   Abdominal   Peds  Hematology HLD   Anesthesia Other Findings BILATERAL INGUINAL HERNIA  STITCH GRANULOMA  Reproductive/Obstetrics                            Anesthesia Physical Anesthesia Plan  ASA: II  Anesthesia Plan: General and Regional   Post-op Pain Management: GA combined w/ Regional for post-op pain   Induction: Intravenous  PONV Risk Score and Plan: 3 and Midazolam, Dexamethasone, Ondansetron and Treatment may vary due to age or medical condition  Airway Management Planned: Oral ETT  Additional Equipment:   Intra-op Plan:   Post-operative Plan: Extubation in OR  Informed Consent: I have reviewed the patients History and Physical, chart, labs and discussed the procedure including the risks, benefits and alternatives for the proposed anesthesia with the patient or authorized representative who has indicated his/her understanding and acceptance.     Dental advisory given  Plan Discussed with: CRNA  Anesthesia Plan Comments:        Anesthesia Quick Evaluation

## 2019-12-23 NOTE — Anesthesia Procedure Notes (Signed)
Procedure Name: Intubation Date/Time: 12/23/2019 11:47 AM Performed by: [place] Catherine Oak C, CRNA Pre-anesthesia Checklist: Patient identified, Emergency Drugs available, Suction available and Patient being monitored Patient Re-evaluated:Patient Re-evaluated prior to induction Oxygen Delivery Method: Circle system utilized Preoxygenation: Pre-oxygenation with 100% oxygen Induction Type: IV induction Ventilation: Mask ventilation without difficulty and Oral airway inserted - appropriate to patient size Laryngoscope Size: Mac and 4 Grade View: Grade II Tube type: Oral Tube size: 7.0 mm Number of attempts: 1 Airway Equipment and Method: Stylet Placement Confirmation: ETT inserted through vocal cords under direct vision,  positive ETCO2 and breath sounds checked- equal and bilateral Secured at: 23 cm Tube secured with: Tape Dental Injury: Teeth and Oropharynx as per pre-operative assessment

## 2019-12-23 NOTE — Discharge Instructions (Signed)
[facility] _______[facility]  UMBILICAL OR INGUINAL HERNIA REPAIR: POST OP INSTRUCTIONS  Always review your discharge instruction sheet given to you by the facility where your surgery was performed. IF YOU HAVE DISABILITY OR FAMILY LEAVE FORMS, YOU MUST BRING THEM TO THE OFFICE FOR PROCESSING.   DO NOT GIVE THEM TO YOUR DOCTOR.  1. A  prescription for pain medication may be given to you upon discharge.  Take your pain medication as prescribed, if needed.  If narcotic pain medicine is not needed, then you may take acetaminophen (Tylenol) or ibuprofen (Advil) as needed. 2. Take your usually prescribed medications unless otherwise directed. If you need a refill on your pain medication, please contact your pharmacy.  They will contact our office to request authorization. Prescriptions will not be filled after 5 pm or on week-ends. 3. You should follow a light diet the first 24 hours after arrival home, such as soup and crackers, etc.  Be sure to include lots of fluids daily.  Resume your normal diet the day after surgery. 4.Most patients will experience some swelling and bruising around the umbilicus or in the groin and scrotum.  Ice packs and reclining will help.  Swelling and bruising can take several days to resolve.  6. It is common to experience some constipation if taking pain medication after surgery.  Increasing fluid intake and taking a stool softener (such as Colace) will usually help or prevent this problem from occurring.  A mild laxative (Milk of Magnesia or Miralax) should be taken according to package directions if there are no bowel movements after 48 hours. 7. Unless discharge instructions indicate otherwise, you may remove your bandages 24-48 hours after surgery, and you may shower at that time.  You may have steri-strips (small skin tapes) in place directly over the incision.  These strips should be left on the skin for 7-10 days.  If your surgeon used skin glue on the  incision, you may shower in 24 hours.  The glue will flake off over the next 2-3 weeks.  Any sutures or staples will be removed at the office during your follow-up visit. 8. ACTIVITIES:  You may resume regular (light) daily activities beginning the next day--such as daily self-care, walking, climbing stairs--gradually increasing activities as tolerated.  You may have sexual intercourse when it is comfortable.  Refrain from any heavy lifting or straining until approved by your doctor.  a.You may drive when you are no longer taking prescription pain medication, you can comfortably wear a seatbelt, and you can safely maneuver your car and apply brakes. b.RETURN TO WORK:   _____________________________________________  9.You should see your doctor in the office for a follow-up appointment approximately 2-3 weeks after your surgery.  Make sure that you call for this appointment within a day or two after you arrive home to insure a convenient appointment time. 10.OTHER INSTRUCTIONS: _________________________    _____________________________________  WHEN TO CALL YOUR DOCTOR: 1. Fever over 101.0 2. Inability to urinate 3. Nausea and/or vomiting 4. Extreme swelling or bruising 5. Continued bleeding from incision. 6. Increased pain, redness, or drainage from the incision  The clinic staff is available to answer your questions during regular business hours.  Please don't hesitate to call and ask to speak to one of the nurses for clinical concerns.  If you have a medical emergency, go to the nearest emergency room or call 911.  A surgeon from [facility] is always on call at the hospital     [address], [place], [place], [place]  [address] ?  [address], [place], [place]   [address] [phone] ? [phone] ? FAX [phone] Web site: [email or web address]     No Tylenol until 3:15 PM on 12/23/2019 No Ibuprofen until 5:15 PM on 12/23/2019     Post  Anesthesia Home Care Instructions  Activity: Get plenty of rest for the remainder of the day. A responsible individual must stay with you for 24 hours following the procedure.  For the next 24 hours, DO NOT: -Drive a car -Paediatric nurse -Drink alcoholic beverages -Take any medication unless instructed by your physician -Make any legal decisions or sign important papers.  Meals: Start with liquid foods such as gelatin or soup. Progress to regular foods as tolerated. Avoid greasy, spicy, heavy foods. If nausea and/or vomiting occur, drink only clear liquids until the nausea and/or vomiting subsides. Call your physician if vomiting continues.  Special Instructions/Symptoms: Your throat may feel dry or sore from the anesthesia or the breathing tube placed in your throat during surgery. If this causes discomfort, gargle with warm salt water. The discomfort should disappear within 24 hours.  If you had a scopolamine patch placed behind your ear for the management of post- operative nausea and/or vomiting:  1. The medication in the patch is effective for 72 hours, after which it should be removed.  Wrap patch in a tissue and discard in the trash. Wash hands thoroughly with soap and water. 2. You may remove the patch earlier than 72 hours if you experience unpleasant side effects which may include dry mouth, dizziness or visual disturbances. 3. Avoid touching the patch. Wash your hands with soap and water after contact with the patch.

## 2019-12-23 NOTE — Interval H&P Note (Signed)
History and Physical Interval Note:  12/23/2019 11:31 AM  Darren Russo  has presented today for surgery, with the diagnosis of STITCH GRANULOMA.  The various methods of treatment have been discussed with the patient and family. After consideration of risks, benefits and other options for treatment, the patient has consented to  Procedure(s) with comments: [place] (N/A) - TAP BLOCK as a surgical intervention.  The patient's history has been reviewed, patient examined, no change in status, stable for surgery.  I have reviewed the patient's chart and labs.  Questions were answered to the patient's satisfaction.     Gary

## 2019-12-23 NOTE — Op Note (Signed)
Preoperative diagnosis: Bilateral inguinal hernia reducible and stitch granuloma in umbilicus  Postoperative diagnosis: Same  Procedure: Open repair of bilateral inguinal hernia with mesh and removal of umbilical stitch granuloma.  Surgeon: Erroll Luna, MD  Anesthesia: General with bilateral TAP blocks and 0.25% local Sensorcaine  EBL: Minimal  Specimen: None  Drains: None  IV fluids: Per anesthesia record  Indications for procedure: The patient presents for open repair of bilateral inguinal hernia.  We discussed laparoscopic and open techniques.  We discussed the use of mesh.  He had a previous umbilical hernia.  His suture and had a suture that was causing a stitch granuloma at his umbilicus and he wonders removed same time.  We discussed different operative techniques, use of mesh, laparoscopic versus open, pros and cons of each and potential complications of each.The risk of hernia repair include bleeding,  Infection,   Recurrence of the hernia,  Mesh use, chronic pain,  Organ injury,  Bowel injury,  Bladder injury,   nerve injury with numbness around the incision,  Death,  and worsening of preexisting  medical problems.  The alternatives to surgery have been discussed as well..  Long term expectations of both operative and non operative treatments have been discussed.   The patient agrees to proceed.  Description of procedure: The patient was met in the holding area.  Questions were answered.  He underwent bilateral blocks per anesthesia.  He was brought to the operative room.  He is placed supine upon the OR table.  After induction of general esthesia the abdomen was prepped and draped in sterile fashion timeout was performed.  Proper patient, site, and procedure were verified.  The left inguinal hernia was addressed first.  A transverse incision was made left inguinal crease.  Dissection was carried down through the subcutaneous fat through Scarpa's fascia until the aponeurosis of the  external oblique was identified.  The external bleed was opened through the external ring.  Dissection was carried around the cord and all cord structures were encircled with quarter inch Penrose drain.  There is a large indirect hernia and this was dissected off the cord and reduced back into the preperitoneal space.  He also had a large lipoma and that was dissected off as well and reduced into the preperitoneal space.  A large ultra pro hernia system was used with the inner leaflet placed into the preperitoneal space and deployed.  The onlay was placed on the floor of the inguinal canal.  The ilioinguinal nerve was tethered into this therefore was divided as it exited the muscle to prevent entrapment and postoperative pain syndrome.  The mesh was then secured to the pubic tubercle, shelving edge of the inguinal ligament and conjoined tendon medially.  A slit was cut for cord structures to allow them to exit the mesh and these were sutured around the cord structures using #1 Novafil.  There is ample room for the cord to exit and no undue tension on the cord.  As the mesh was then placed to cover the defect well without signs of bleeding.  Hemostasis achieved cautery.  There is ample room for the cord structures to exit without any entrapment or tethering.  The external oblique fascia was closed with 2-0 Vicryl.  3-0 Vicryl was used to close Scarpa's fascia and 4 Monocryl was used to close the skin in subcuticular fashion.  The right side was done second.  A similar incision was made in the radial crease.  Dissection was carried down  to the aponeurosis of the external bleed was identified.  Small incision was made in the fascia was opened through the external ring.  The cord structures and ilioinguinal nerve were identified and preserved.  Quarter inch Penrose drain was used to help with that.  He had a large indirect defect hernia on the right.  The floor of the inguinal canal was extremely thin and bulging.   Open this to injure the preperitoneal space.  A large ultra pro hernia system was used with the inner leaflet placed to the direct defect and deployed into the preperitoneal space.  A small slit was cut for the cord structures and nerve.  This was sutured to the shelving edge of the inguinal ligament, combo joint tendon and pubic tubercle with #1 Novafil.  The cord was allowed to exit the mesh and the mesh was closed around the cord carefully using #1 Novafil.  There is no undue tension on the cord or nerve structures.  There is no entrapment of the structures.  The mesh laid quite nicely.  Hemostasis was achieved.  The external oblique aponeurosis was closed with 2-0 Vicryl.  3-0 Vicryl was used to approximate Scarpa's fascia and 4-0 Monocryl was used to close the skin in a subcuticular fashion.  Dermabond applied to both incisions.  Attention was turned to the stitch granuloma at the umbilicus.  I was able used a hemostat to identify a stitch which was a Prolene from previous umbilical hernia repair many years ago.  I was able to cut the suture out.  There is excessive granulation tissue and this was cauterized with cautery carefully.  This was then covered with a dry dressing.  All counts were found to be correct this portion of the case.  The patient was then awoke extubated taken to recovery in satisfactory condition.

## 2019-12-23 NOTE — Anesthesia Postprocedure Evaluation (Signed)
Anesthesia Post Note  Patient: Darren Russo  Procedure(s) Performed: BILATERAL INGUINAL HERNIA REPAIR WITH MESH, EXCISION OF UMBILICAL STITCH GRANULOMA (Bilateral Abdomen)     Patient location during evaluation: PACU Anesthesia Type: Regional and General Level of consciousness: awake and alert Pain management: pain level controlled Vital Signs Assessment: post-procedure vital signs reviewed and stable Respiratory status: spontaneous breathing, nonlabored ventilation and respiratory function stable Cardiovascular status: blood pressure returned to baseline and stable Postop Assessment: no apparent nausea or vomiting Anesthetic complications: no    Last Vitals:  Vitals:   12/23/19 1445 12/23/19 1451  BP: 129/82 140/83  Pulse: 67 69  Resp: 20   Temp:  36.7 C  SpO2: 98% 96%    Last Pain:  Vitals:   12/23/19 1451  TempSrc:   PainSc: 6                  Lynda Rainwater

## 2019-12-23 NOTE — Progress Notes (Signed)
Assisted Dr. Roanna Banning with right and left, ultrasound guided, transabdominal plane blocks. Side rails up, monitors on throughout procedure. See vital signs in flow sheet. Tolerated Procedure well.

## 2019-12-23 NOTE — Transfer of Care (Signed)
Immediate Anesthesia Transfer of Care Note  Patient: CAMARI TRAVERS  Procedure(s) Performed: BILATERAL INGUINAL HERNIA REPAIR WITH MESH, EXCISION OF UMBILICAL STITCH GRANULOMA (Bilateral Abdomen)  Patient Location: PACU  Anesthesia Type:General  Level of Consciousness: awake, alert  and oriented  Airway & Oxygen Therapy: Patient Spontanous Breathing and Patient connected to face mask oxygen  Post-op Assessment: Report given to RN and Post -op Vital signs reviewed and stable  Post vital signs: Reviewed and stable  Last Vitals:  Vitals Value Taken Time  BP 116/79 12/23/19 1345  Temp 36.4 C 12/23/19 1345  Pulse 57 12/23/19 1348  Resp 15 12/23/19 1348  SpO2 100 % 12/23/19 1348  Vitals shown include unvalidated device data.  Last Pain:  Vitals:   12/23/19 1345  TempSrc:   PainSc: (P) 0-No pain         Complications: No apparent anesthesia complications

## 2019-12-24 ENCOUNTER — Encounter: Payer: Self-pay | Admitting: *Deleted

## 2019-12-24 NOTE — Addendum Note (Signed)
Addendum  created 12/24/19 0912 by Tawni Millers, CRNA   Charge Capture section accepted

## 2020-06-16 DIAGNOSIS — Z0000 Encounter for general adult medical examination without abnormal findings: Secondary | ICD-10-CM | POA: Diagnosis not present

## 2020-06-16 DIAGNOSIS — Z125 Encounter for screening for malignant neoplasm of prostate: Secondary | ICD-10-CM | POA: Diagnosis not present

## 2020-06-16 DIAGNOSIS — E7800 Pure hypercholesterolemia, unspecified: Secondary | ICD-10-CM | POA: Diagnosis not present

## 2021-02-09 DIAGNOSIS — I10 Essential (primary) hypertension: Secondary | ICD-10-CM | POA: Diagnosis not present

## 2021-02-09 DIAGNOSIS — E7800 Pure hypercholesterolemia, unspecified: Secondary | ICD-10-CM | POA: Diagnosis not present

## 2021-09-08 DIAGNOSIS — E7800 Pure hypercholesterolemia, unspecified: Secondary | ICD-10-CM | POA: Diagnosis not present

## 2021-09-08 DIAGNOSIS — Z125 Encounter for screening for malignant neoplasm of prostate: Secondary | ICD-10-CM | POA: Diagnosis not present

## 2021-09-08 DIAGNOSIS — I10 Essential (primary) hypertension: Secondary | ICD-10-CM | POA: Diagnosis not present

## 2021-09-08 DIAGNOSIS — Z0000 Encounter for general adult medical examination without abnormal findings: Secondary | ICD-10-CM | POA: Diagnosis not present

## 2022-03-09 DIAGNOSIS — I10 Essential (primary) hypertension: Secondary | ICD-10-CM | POA: Diagnosis not present

## 2022-03-09 DIAGNOSIS — E7800 Pure hypercholesterolemia, unspecified: Secondary | ICD-10-CM | POA: Diagnosis not present

## 2022-03-09 DIAGNOSIS — M461 Sacroiliitis, not elsewhere classified: Secondary | ICD-10-CM | POA: Diagnosis not present

## 2022-04-28 DIAGNOSIS — R070 Pain in throat: Secondary | ICD-10-CM | POA: Diagnosis not present

## 2022-04-28 DIAGNOSIS — J069 Acute upper respiratory infection, unspecified: Secondary | ICD-10-CM | POA: Diagnosis not present

## 2022-09-10 DIAGNOSIS — I10 Essential (primary) hypertension: Secondary | ICD-10-CM | POA: Diagnosis not present

## 2022-09-10 DIAGNOSIS — E7800 Pure hypercholesterolemia, unspecified: Secondary | ICD-10-CM | POA: Diagnosis not present

## 2022-09-10 DIAGNOSIS — Z125 Encounter for screening for malignant neoplasm of prostate: Secondary | ICD-10-CM | POA: Diagnosis not present

## 2022-09-10 DIAGNOSIS — Z0000 Encounter for general adult medical examination without abnormal findings: Secondary | ICD-10-CM | POA: Diagnosis not present

## 2022-12-13 DIAGNOSIS — H524 Presbyopia: Secondary | ICD-10-CM | POA: Diagnosis not present

## 2023-03-11 DIAGNOSIS — E7800 Pure hypercholesterolemia, unspecified: Secondary | ICD-10-CM | POA: Diagnosis not present

## 2023-03-11 DIAGNOSIS — I10 Essential (primary) hypertension: Secondary | ICD-10-CM | POA: Diagnosis not present

## 2023-03-11 DIAGNOSIS — L989 Disorder of the skin and subcutaneous tissue, unspecified: Secondary | ICD-10-CM | POA: Diagnosis not present

## 2023-06-27 DIAGNOSIS — T8189XA Other complications of procedures, not elsewhere classified, initial encounter: Secondary | ICD-10-CM | POA: Diagnosis not present

## 2023-07-15 DIAGNOSIS — T8189XA Other complications of procedures, not elsewhere classified, initial encounter: Secondary | ICD-10-CM | POA: Diagnosis not present
